# Patient Record
Sex: Female | Born: 1935 | Race: White | Hispanic: No | Smoking: Never smoker
Health system: Southern US, Community
[De-identification: ages and names within clinical notes are randomized; demographics above are authoritative.]

## PROBLEM LIST (undated history)

## (undated) DIAGNOSIS — R61 Generalized hyperhidrosis: Secondary | ICD-10-CM

## (undated) DIAGNOSIS — M797 Fibromyalgia: Secondary | ICD-10-CM

## (undated) DIAGNOSIS — E7800 Pure hypercholesterolemia, unspecified: Secondary | ICD-10-CM

## (undated) DIAGNOSIS — N289 Disorder of kidney and ureter, unspecified: Secondary | ICD-10-CM

## (undated) DIAGNOSIS — K5790 Diverticulosis of intestine, part unspecified, without perforation or abscess without bleeding: Secondary | ICD-10-CM

## (undated) DIAGNOSIS — G629 Polyneuropathy, unspecified: Secondary | ICD-10-CM

## (undated) DIAGNOSIS — I10 Essential (primary) hypertension: Secondary | ICD-10-CM

## (undated) DIAGNOSIS — E119 Type 2 diabetes mellitus without complications: Secondary | ICD-10-CM

## (undated) HISTORY — DX: Diverticulosis of intestine, part unspecified, without perforation or abscess without bleeding: K57.90

## (undated) HISTORY — DX: Type 2 diabetes mellitus without complications: E11.9

## (undated) HISTORY — DX: Essential (primary) hypertension: I10

## (undated) HISTORY — DX: Pure hypercholesterolemia, unspecified: E78.00

## (undated) HISTORY — DX: Generalized hyperhidrosis: R61

## (undated) HISTORY — DX: Disorder of kidney and ureter, unspecified: N28.9

## (undated) HISTORY — PX: OTHER SURGICAL HISTORY: SHX169

## (undated) HISTORY — PX: CATARACT EXTRACTION: SUR2

## (undated) HISTORY — DX: Fibromyalgia: M79.7

## (undated) HISTORY — PX: VAGINAL HYSTERECTOMY: SUR661

---

## 1992-01-07 HISTORY — PX: APPENDECTOMY: SHX54

## 1992-01-07 HISTORY — PX: CHOLECYSTECTOMY: SHX55

## 1997-08-15 ENCOUNTER — Other Ambulatory Visit: Admission: RE | Admit: 1997-08-15 | Discharge: 1997-08-15 | Payer: Self-pay | Admitting: Obstetrics and Gynecology

## 1997-09-02 ENCOUNTER — Inpatient Hospital Stay (HOSPITAL_COMMUNITY): Admission: EM | Admit: 1997-09-02 | Discharge: 1997-09-04 | Payer: Self-pay | Admitting: Emergency Medicine

## 1998-08-31 ENCOUNTER — Other Ambulatory Visit: Admission: RE | Admit: 1998-08-31 | Discharge: 1998-08-31 | Payer: Self-pay | Admitting: Obstetrics and Gynecology

## 1999-07-26 ENCOUNTER — Encounter: Admission: RE | Admit: 1999-07-26 | Discharge: 1999-07-26 | Payer: Self-pay | Admitting: *Deleted

## 1999-07-26 ENCOUNTER — Encounter: Payer: Self-pay | Admitting: *Deleted

## 2000-04-02 ENCOUNTER — Ambulatory Visit (HOSPITAL_COMMUNITY): Admission: RE | Admit: 2000-04-02 | Discharge: 2000-04-02 | Payer: Self-pay | Admitting: Gastroenterology

## 2000-07-28 ENCOUNTER — Encounter: Payer: Self-pay | Admitting: *Deleted

## 2000-07-28 ENCOUNTER — Ambulatory Visit (HOSPITAL_COMMUNITY): Admission: RE | Admit: 2000-07-28 | Discharge: 2000-07-28 | Payer: Self-pay | Admitting: *Deleted

## 2000-09-11 ENCOUNTER — Encounter: Payer: Self-pay | Admitting: Thoracic Surgery

## 2000-09-11 ENCOUNTER — Encounter (INDEPENDENT_AMBULATORY_CARE_PROVIDER_SITE_OTHER): Payer: Self-pay | Admitting: *Deleted

## 2000-09-11 ENCOUNTER — Inpatient Hospital Stay (HOSPITAL_COMMUNITY): Admission: RE | Admit: 2000-09-11 | Discharge: 2000-09-13 | Payer: Self-pay | Admitting: Thoracic Surgery

## 2000-09-12 ENCOUNTER — Encounter: Payer: Self-pay | Admitting: Thoracic Surgery

## 2000-09-13 ENCOUNTER — Encounter: Payer: Self-pay | Admitting: Thoracic Surgery

## 2000-09-22 ENCOUNTER — Encounter: Admission: RE | Admit: 2000-09-22 | Discharge: 2000-09-22 | Payer: Self-pay | Admitting: Thoracic Surgery

## 2000-09-22 ENCOUNTER — Encounter: Payer: Self-pay | Admitting: Thoracic Surgery

## 2000-11-19 ENCOUNTER — Inpatient Hospital Stay (HOSPITAL_COMMUNITY): Admission: RE | Admit: 2000-11-19 | Discharge: 2000-11-23 | Payer: Self-pay | Admitting: Thoracic Surgery

## 2000-11-19 ENCOUNTER — Encounter (INDEPENDENT_AMBULATORY_CARE_PROVIDER_SITE_OTHER): Payer: Self-pay | Admitting: Specialist

## 2000-11-19 ENCOUNTER — Encounter: Payer: Self-pay | Admitting: Thoracic Surgery

## 2000-11-20 ENCOUNTER — Encounter: Payer: Self-pay | Admitting: Thoracic Surgery

## 2000-11-21 ENCOUNTER — Encounter: Payer: Self-pay | Admitting: Thoracic Surgery

## 2000-11-22 ENCOUNTER — Encounter: Payer: Self-pay | Admitting: Thoracic Surgery

## 2000-11-23 ENCOUNTER — Encounter: Payer: Self-pay | Admitting: Thoracic Surgery

## 2000-12-01 ENCOUNTER — Encounter: Payer: Self-pay | Admitting: Thoracic Surgery

## 2000-12-01 ENCOUNTER — Encounter: Admission: RE | Admit: 2000-12-01 | Discharge: 2000-12-01 | Payer: Self-pay | Admitting: Thoracic Surgery

## 2000-12-11 ENCOUNTER — Encounter: Admission: RE | Admit: 2000-12-11 | Discharge: 2000-12-11 | Payer: Self-pay | Admitting: Thoracic Surgery

## 2000-12-11 ENCOUNTER — Encounter: Payer: Self-pay | Admitting: Thoracic Surgery

## 2001-07-29 ENCOUNTER — Encounter: Payer: Self-pay | Admitting: *Deleted

## 2001-07-29 ENCOUNTER — Ambulatory Visit (HOSPITAL_COMMUNITY): Admission: RE | Admit: 2001-07-29 | Discharge: 2001-07-29 | Payer: Self-pay | Admitting: *Deleted

## 2001-08-30 ENCOUNTER — Encounter: Payer: Self-pay | Admitting: Gastroenterology

## 2001-08-30 ENCOUNTER — Encounter: Admission: RE | Admit: 2001-08-30 | Discharge: 2001-08-30 | Payer: Self-pay | Admitting: Gastroenterology

## 2002-01-06 HISTORY — PX: SHOULDER SURGERY: SHX246

## 2002-08-01 ENCOUNTER — Encounter: Payer: Self-pay | Admitting: *Deleted

## 2002-08-01 ENCOUNTER — Ambulatory Visit (HOSPITAL_COMMUNITY): Admission: RE | Admit: 2002-08-01 | Discharge: 2002-08-01 | Payer: Self-pay | Admitting: *Deleted

## 2003-06-21 ENCOUNTER — Inpatient Hospital Stay (HOSPITAL_COMMUNITY): Admission: RE | Admit: 2003-06-21 | Discharge: 2003-06-22 | Payer: Self-pay | Admitting: Orthopedic Surgery

## 2003-08-08 ENCOUNTER — Encounter: Admission: RE | Admit: 2003-08-08 | Discharge: 2003-08-08 | Payer: Self-pay | Admitting: *Deleted

## 2004-02-07 ENCOUNTER — Encounter: Admission: RE | Admit: 2004-02-07 | Discharge: 2004-02-07 | Payer: Self-pay | Admitting: Sports Medicine

## 2004-02-23 ENCOUNTER — Encounter: Admission: RE | Admit: 2004-02-23 | Discharge: 2004-02-23 | Payer: Self-pay | Admitting: Sports Medicine

## 2004-03-08 ENCOUNTER — Encounter: Admission: RE | Admit: 2004-03-08 | Discharge: 2004-03-08 | Payer: Self-pay | Admitting: Sports Medicine

## 2004-08-15 ENCOUNTER — Encounter: Admission: RE | Admit: 2004-08-15 | Discharge: 2004-08-15 | Payer: Self-pay | Admitting: *Deleted

## 2005-08-19 ENCOUNTER — Encounter: Admission: RE | Admit: 2005-08-19 | Discharge: 2005-08-19 | Payer: Self-pay | Admitting: *Deleted

## 2006-08-21 ENCOUNTER — Encounter: Admission: RE | Admit: 2006-08-21 | Discharge: 2006-08-21 | Payer: Self-pay | Admitting: *Deleted

## 2006-12-02 ENCOUNTER — Encounter: Admission: RE | Admit: 2006-12-02 | Discharge: 2006-12-02 | Payer: Self-pay | Admitting: Geriatric Medicine

## 2007-05-12 ENCOUNTER — Encounter: Admission: RE | Admit: 2007-05-12 | Discharge: 2007-05-12 | Payer: Self-pay | Admitting: Gastroenterology

## 2007-08-23 ENCOUNTER — Encounter: Admission: RE | Admit: 2007-08-23 | Discharge: 2007-08-23 | Payer: Self-pay | Admitting: Geriatric Medicine

## 2007-09-03 ENCOUNTER — Encounter: Admission: RE | Admit: 2007-09-03 | Discharge: 2007-09-03 | Payer: Self-pay | Admitting: Geriatric Medicine

## 2008-01-07 HISTORY — PX: TONSILLECTOMY: SUR1361

## 2008-06-01 ENCOUNTER — Encounter: Admission: RE | Admit: 2008-06-01 | Discharge: 2008-06-01 | Payer: Self-pay | Admitting: Sports Medicine

## 2008-06-19 ENCOUNTER — Encounter: Admission: RE | Admit: 2008-06-19 | Discharge: 2008-06-19 | Payer: Self-pay | Admitting: Sports Medicine

## 2008-08-23 ENCOUNTER — Encounter: Admission: RE | Admit: 2008-08-23 | Discharge: 2008-08-23 | Payer: Self-pay | Admitting: Geriatric Medicine

## 2008-10-04 ENCOUNTER — Encounter: Admission: RE | Admit: 2008-10-04 | Discharge: 2008-10-04 | Payer: Self-pay | Admitting: Geriatric Medicine

## 2008-10-26 ENCOUNTER — Encounter: Admission: RE | Admit: 2008-10-26 | Discharge: 2008-10-26 | Payer: Self-pay | Admitting: Sports Medicine

## 2008-12-06 ENCOUNTER — Encounter: Admission: RE | Admit: 2008-12-06 | Discharge: 2008-12-06 | Payer: Self-pay | Admitting: Sports Medicine

## 2008-12-15 ENCOUNTER — Encounter: Admission: RE | Admit: 2008-12-15 | Discharge: 2008-12-15 | Payer: Self-pay | Admitting: Sports Medicine

## 2009-08-27 ENCOUNTER — Encounter: Admission: RE | Admit: 2009-08-27 | Discharge: 2009-08-27 | Payer: Self-pay | Admitting: Geriatric Medicine

## 2010-01-28 ENCOUNTER — Encounter: Payer: Self-pay | Admitting: Sports Medicine

## 2010-05-24 NOTE — Discharge Summary (Signed)
NAME:  Ariel Rogers, Ariel Rogers                           ACCOUNT NO.:  [id number]   MEDICAL RECORD NO.:  [id number]                   PATIENT TYPE:  INP   LOCATION:  5024                                 FACILITY:  [facility]   PHYSICIAN:  Loreta Ave, M.D.              DATE OF BIRTH:  June 07, 1924   DATE OF ADMISSION:  06/21/2003  DATE OF DISCHARGE:  06/22/2003                                 DISCHARGE SUMMARY   ADMISSION DIAGNOSIS:  Advanced degenerative joint disease of the left  shoulder.   DISCHARGE DIAGNOSES:  1. 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  2. Rotator cuff tear.  3. Hypertension.  4. Type 2 diabetes.   PROCEDURE:  Left total shoulder replacement.   HISTORY:  This is a 77 year old female with a three to four year history of  gradually increasing pain of the left shoulder with difficulty with  activities.  Radiographic's show end-stage DJD, left shoulder.  She has  failed conservative treatment.  Indicates for a left total shoulder  replacement.   HOSPITAL COURSE:  A 77 year old white female admitted on June 21, 2003,  after appropriate laboratory studies were obtained as well as 1 g of Ancef  IV on call to the operating room, was taken to the operating room where she  underwent a left total shoulder replacement.  She tolerated the procedure  well.  She had a preoperative scalene block.  She was put on oxygen 2 to 3 L  per minute via nasal cannula postoperatively.  Routine medications were  continued.  PCA pump, using a reduced dose of Dilaudid was used.  She did  very well during the postoperative night.  Her IV was hep-locked and she was  discharged on June 22, 2003, to return back to the office in followup in two  weeks.  EKG reveals normal sinus rhythm with possible anterior infarct, age  undetermined.  There is borderline left axis deviation with poor R-wave  progression, V1 through V6.  No significant change since last tracing.  Shoulder x-ray on  June 21, 2003, of the left reveals good position AP and  through the body transthoracic lateral.  Laboratory studies are notable for  a hemoglobin of 14, hematocrit 40.7, white count 8600, platelets 285,000.  Discharge hemoglobin 11.1, hematocrit 32.3, white count 9600, platelets  213,000.  Prothrombin time was 13 with an INR of 1.  Preoperative  chemistries:  Sodium 142, potassium 4.6, chloride 106, CO2 30, glucose 102,  BUN 15, creatinine 0.9, calcium 9.5, total protein 6.9, albumin 4, AST 20,  ALT 17, ALP 85, total bilirubin 0.6.  Discharge sodium 139, potassium 4,  chloride 105, CO2 30, glucose 128, BUN 12, creatinine 0.9, calcium 8.6.  Urinalysis was benign for a voided urine.  Blood type was A positive,  antibody screen negative.   DISCHARGE MEDICATIONS:  1. The patient is given a prescription for Percocet one  to two tabs q.4h.     p.r.n. pain.  2. Resume preoperative medications.  3. Colace 100 mg b.i.d. as a stool softener.   DIET:  As tolerated.   ACTIVITY:  As taught in physical therapy.   WOUND CARE:  Follow the blue instruction sheet.  Call if increased pain or  swelling.  Keep your dressing on until seen in the office.   FOLLOWUP:  Call the office for an appointment to be seen in three to five  days for dressing removal.   CONDITION ON DISCHARGE:  Improved.      Oris Drone Petrarca, P.A.-C.                Loreta Ave, M.D.    BDP/MEDQ  D:  07/14/2003  T:  07/15/2003  Job:  [id number]

## 2010-05-24 NOTE — Op Note (Signed)
NAME:  Ariel Rogers, Ariel Rogers                           ACCOUNT NO.:  [id number]   MEDICAL RECORD NO.:  [id number]                   PATIENT TYPE:  INP   LOCATION:  2550                                 FACILITY:  [facility]   PHYSICIAN:  Loreta Ave, M.D.              DATE OF BIRTH:  1924/11/02   DATE OF PROCEDURE:  06/21/2003  DATE OF DISCHARGE:                                 OPERATIVE REPORT   PREOPERATIVE DIAGNOSES:  End-stage degenerative arthritis, bone on bone left  shoulder with marked para-articular spurring, loose bodies and extensive  adhesions.  Rotator cuff tear, anterior aspect supraspinatus tendon.   POSTOPERATIVE DIAGNOSES:  End-stage degenerative arthritis, bone on bone  left shoulder with marked para-articular spurring, loose bodies and  extensive adhesions.  Rotator cuff tear, anterior aspect supraspinatus  tendon.   PROCEDURE:  Left shoulder examination under anesthesia, open exploration  with extensive debridement shoulder, removal of spurs and loose bodies,  total shoulder replacement with a cemented pegged #7 [employer] glenoid  component, a press-fit 10 mm humeral stem [employer] with a 40 mm x 15 mm  head.  Repeat of rotator cuff tear including repair of interval tear between  subscapularis and supraspinatus tendon.   SURGEON:  Loreta Ave, M.D.   ASSISTANT:  Arlys John D. Petrarca, P.A.-C.   ANESTHESIA:  General.   SPECIMENS:  Excised bone and soft tissue.   CULTURES:  None.   COMPLICATIONS:  None.   DRESSINGS:  Soft compressive with shoulder immobilizer.   BLOOD LOSS:  Minimal.   DESCRIPTION OF PROCEDURE:  The patient was brought to the operating room and  after adequate anesthesia had been obtained,  placed in the beach chair  position, shoulder positioner, prepped and draped in the usual sterile  fashion.  Shoulder was examined, extremely restricted motion less than 80 degrees of  forward flexion and abduction from bony erosion.  Virtually no  external  rotation and internal rotation of about 60 degrees.   After being prepped and draped, anterior incision from coracoid along the  deltopectoral interval.  Skin and subcutaneous tissues divided.  Deltopectoral interval opened. Cephalic vein initially protected.  With  distal retraction, this tore and this was ligated to prevent bleeding. Front  of the shoulder exposed.  Interval tearing between the front of the  supraspinatus and subscapularis and a little torn off the edge. For the most  part, the __________ remained good.  Subscapularis was taken down, opening  up the interval tear and the supraspinatus behind this was debrided so I  could do a side-to-side closure and repair completion.  Long head of biceps  tendon intact.  Subscapularis was retracted, exposing the shoulder.  Care  was taken to protect neurovascular structures including axillary nerve.  Grade 4 changes throughout. Extensive  spurring.   Humeral head was cut with 30 degrees of retroversion along the surgical  neck.  All recess examined,  all loose bodies removed, all spurs removed as  much as possible to prepare the shoulder for replacement. I got as inferior  as possible on the humerus, removing all spurs that would limit motion.  There were also extensive loose bodies and spurs on the back of the glenoid  removed. Once this was complete, the glenoid was exposed and prepared, after  being measured for a #7 component, reamed of bleeding bone.  I drilled  further pegged component, multiple other small drill holes for cementing.  The humerus was then prepared, all with hand-held reamers up to a #10 mm  distal fit.   Utilizing the resected head and with trials, I chose a 40 x 15 mm head.  When that was complete, all trials were removed. The shoulder was copiously  irrigated.  Cement was prepared, placed on a glenoid component which was  firmly cemented in place, cemented on the glenoid.  When that was complete,   the humerus was driven down into the humeral shaft, which was only 30  degrees of retroversion.  Nice seating and fitting. The 40 x 15 mm head was  attached and the shoulder reduced. This gave good motion, excellent  stability and a good sizing from both sides of the joint. The wound was  irrigated, all recesses examined to be sure all loose bodies removed.   Subscapularis was then repaired with Ethibond including repair of the  anterior supraspinatus tearing and interval tearing to a nice, firm, water-  tight closure of the cuff throughout.  Retractors were removed, hemostasis  throughout.  Deltopectoral interval allowed to close.  Subcutaneous tissue  and subcuticular closure of the wound.  Margins of the wound injected with  Marcaine. Sterile, compressive dressing and shoulder immobilizer applied.  Anesthesia was reversed, brought to the recovery room, tolerated surgery  well, no complications.                                               Loreta Ave, M.D.    DFM/MEDQ  D:  06/21/2003  T:  06/21/2003  Job:  [id number]

## 2010-05-24 NOTE — Discharge Summary (Signed)
[place]. [facility]  Patient:    Ariel Rogers, Ariel Rogers Visit Number: [id number] MRN: [id number]          Service Type: SUR Location: 3300 3314 01 Attending Physician:  Cameron Proud Dictated by:   Dominica Severin, M.D. Admit Date:  11/19/2000 Disc. Date: 11/23/00   CC:         CVTS Office  Robert H. Janey Greaser, M.D.   Discharge Summary  PRIMARY ADMISSION DIAGNOSIS:  hyperhidrosis.  SECONDARY DIAGNOSIS/PAST MEDICAL HISTORY: 1. Hypertension. 2. Anxiety. 3. Depression. 4. Hyperhidrosis. 5. Chronic constipation.  NEW DIAGNOSIS/DISCHARGE DIAGNOSIS:  Hyperhidrosis, status post right ______ and main thoracotomy with dorsal sympathectomy on November 19, 2000.  PROCEDURES:  Right ______/right main thoracotomy with dorsal sympthatectomy done on November 19, 2000.  HOSPITAL COURSE:  This patient is a 77 year old Caucasian female with a known history of hyperhidrosis, status left ______ dorsal sympathectomy on September 07, 2000, by D. Karle Plumber, M.D.  The patient is now returning and plans to proceed with right ______ and dorsal sympathectomy, regarding her scalp condition, as well as underarms and palms.  The patient was seen by D. Karle Plumber, M.D., and agreed to proceed with the procedure as stated above. She underwent the procedure on November 19, 2000.  She tolerated the procedure well.  Her postoperative course was essentially uneventful.  She remained hemodynamically stable. Her respiratory status was stable.  She continued to progress daily throughout her stay.  She was tolerating her diet without difficulty.  She did have some hypokalemia, and her potassium was replaced.  Her wounds remain clean and dry without any signs of complications or infection.  She was ambulating without difficulty, and she is anticipated for discharge the morning of November 23, 2000.  Her discharge condition is stable.  DISCHARGE MEDICATIONS: 1. Clonidine 0.2 gm  b.i.d. 2. Coated aspirin 325 mg daily. 3. Nortriptyline 50 mg daily. 4. Hydrochlorothiazide 25 mg 0.5 tab daily. 5. Paxil 30 mg daily. 6. Centrum vitamin daily. 7. TUMS as needed. 8. Calcium 2 daily. 9. Ultram 50 mg 1 or 2 tablets every 4 hours as needed for pain.  ACTIVITY AND FOLLOWUP: The patient is instructed not to do any driving, avoid heavy lifting, and to avoid any strenuous activity. She is to walk daily and continue breathing exercises.  She is to follow a heart healthy diet.  She is to told that she may shower with mild soap and water.  She is to notify the office of any increased redness, swelling, drainage, or temperatures greater than 101 degrees Fahrenheit.  She is to see D. Karle Plumber, M.D., in one week in our office or call her with that appointment. She is also to obtain a chest x-ray on that day of the [facility].  She is also instructed to follow up with Al Decant. Janey Greaser, M.D., as directed. Dictated by:   Dominica Severin, M.D. Attending Physician:  Cameron Proud DD:  11/22/00 TD:  11/23/00 Job: [id number]

## 2010-05-24 NOTE — Discharge Summary (Signed)
[place]. [facility]  Patient:    Ariel Rogers, Ariel Rogers Visit Number: [id number] MRN: [id number]          Service Type: SUR Location: 3300 3316 01 Attending Physician:  Shellia Cleverly. Luisa Hart Md Dictated by:   Gwenyth Bender, R.N.F.A. Admit Date:  09/11/2000 Discharge Date: 09/13/2000   CC:         Al Decant. Janey Greaser, M.D.   Discharge Summary  DATE OF BIRTH:  1924/03/16  ADMITTING DIAGNOSES:  hyperhidrosis.  PAST MEDICAL HISTORY: 1. Hypertension. 2. Anxiety. 3. Depression.  ALLERGIES:  CODEINE.  DISCHARGE DIAGNOSES: 1. Hyperhidrosis status post left dorsal sympathectomy.  BRIEF HISTORY:  [name] is a 77 year old white female with 37-year history of profuse sweating, especially in her head and scalp.  Also involves her underarm and palms.  She has tried several treatments in the past with no improvement.  She was seen by Dr. Edwyna Shell in this office and after review of the records and evaluation of the patient he recommended T2-T5 sympathectomy for potential relief of her symptoms.  HOSPITAL COURSE:  On September 11, 2000 [name] was electively admitted to [facility] under the care of Dr. Algis Downs. Karle Plumber.  She underwent an uncomplicated left dorsal sympathectomy.  At the conclusion of the procedure she was transferred in stable condition to the PACU.  She was made hemodynamically stable throughout her postoperative course.  [name] is making excellent progress in recovering from her surgery.  She is ready for discharge this morning September 13, 2000.  CONDITION ON DISCHARGE:  Improved.  DISCHARGE INSTRUCTIONS:  Medications, activity, diet, wound care, followup appointments.  Please see the discharge instruction sheet for details.  DISCHARGE MEDICATIONS: 1. Tylox one to two p.o. q.4-6h. p.r.n. for pain. 2. Aspirin 325 mg q.d. 3. Vitamin B12 one pill q.d. 4. Centrum multivitamin q.d. 5. HCTZ 25 mg q.d. 6. Nortriptyline 50 mg  p.o. q.h.s. 7. Paxil 30 mg q.d.  FOLLOW-UP:  [name] will have an appointment at the CVTS office to see Dr. Edwyna Shell in approximately one week.  The office will call with a date and time for that appointment.  She has also been asked to get a chest x-ray at [facility] approximately one hour before that appointment. Dictated by:   Gwenyth Bender, R.N.F.A. Attending Physician:  Shellia Cleverly. Luisa Hart Md DD:  09/13/00 TD:  09/14/00 Job: [id number]

## 2010-05-24 NOTE — Op Note (Signed)
[place]. [facility]  Patient:    Ariel Rogers, Ariel Rogers Visit Number: [id number] MRN: [id number]          Service Type: SUR Location: 3300 3316 01 Attending Physician:  Cameron Proud Dictated by:   D. Karle Plumber, M.D. Proc. Date: 09/11/00 Admit Date:  09/11/2000                             Operative Report  PREOPERATIVE DIAGNOSIS: Severe hyperhidrosis of the scalp.  OPERATION/PROCEDURE: Left dorsal sympathectomy.  SURGEON: D. Karle Plumber, M.D.  ASSISTANT: Sherrie George, P.A.-C.  ANESTHESIA: General.  DESCRIPTION OF PROCEDURE: After percutaneous insertion of all monitoring lines the patient under general anesthesia was turned to the left lateral thoracotomy position and was prepped and draped in the usual sterile manner. This patient had severe scalp hyperhidrosis as well as some axillary hyperhidrosis.  It was thought this might improve and after multiple medications had been unsuccessful this hopefully was going to be able to be improved with dorsal sympathectomy.  After prepping and draping the left chest a trocar site was made in the fourth intercostal space in the mid axillary line and then two trocars in the anterior and posterior axillary space at the third intercostal space.  The two in the third intercostal space were 5 mm and the one in the fourth intercostal space was a 10 mm.  A 10 mm scope was inserted and the lung was deflated, and over the head of the second and third ribs the pleura was excised, excising the second ribs down to the fifth rib. The dorsal sympathetic chain was identified and looped with a hook cautery and then using hook scissors the rami were dissected up from the second, third, fourth, and down to the fifth sympathetic rami, clipping all branches and dividing them with electrocautery.  The chain was then divided distally and dissected anteriorly, dissecting up the rami up to the second rami and then the  second rami was divided just at the head of the superior portion of the second rib just right above the second R T2 ganglion.  All rami were divided proximally and distally in order to get as wide a disruption of the rami as possible.  The area was irrigated copiously and the chain was removed and sent for permanent section.  A chest tube was inserted through the 10 mm port and tied in place with two 0 silks.  The other two trocar sites were closed with 3-0 Vicryl and Steri-Strips.  The lung was reinflated and the patient was returned to the recovery room in stable condition. Dictated by:   D. Karle Plumber, M.D. Attending Physician:  Cameron Proud DD:  09/11/00 TD:  09/12/00 Job: [id number]

## 2010-05-24 NOTE — Op Note (Signed)
[place]. [facility]  Patient:    Ariel Rogers, Ariel Rogers Visit Number: [id number] MRN: [id number]          Service Type: SUR Location: [facility] 2899 20 Attending Physician:  Cameron Proud Dictated by:   D. Karle Plumber, M.D. Admit Date:  11/19/2000                             Operative Report  PREOPERATIVE DIAGNOSIS:  Palmar, axillary and scalp hyperhidrosis.  POSTOPERATIVE DIAGNOSIS:  Palmar, axillary and scalp hyperhidrosis.  OPERATION PERFORMED:  Right video-assisted thoracic surgery, right transaxillary thoracotomy, dorsal sympathectomy.  SURGEON:  D. Karle Plumber, M.D.  FIRST ASSISTANT:  Adair Patter, P.A.  INDICATION:  This patient had a previous left thoracoscopic dorsal sympathectomy with an excellent result for severe scalp and axillary hyperhidrosis.  She now returns for the right side.  DESCRIPTION OF PROCEDURE:  After general anesthesia, she was turned to the right lateral thoracotomy position and she was prepped and draped in usual sterile manner.  A 10-mm trocar site was made in the fourth intercostal space at the midaxillary line and a 0 degree scope was inserted but the lung could not be deflated and it was found to have several adhesions to the apex.  For this reason, two other trocar sites of 5 mm were made at the third intercostal space at the anterior and posterior axillary lines and two 5-mm trocars were inserted and the adhesions were taken down with scissors and electrocautery. Finally, this was able to then start opening the pleura and the pleura was opened from the second down to the fifth space, however, in doing this, sympathetic chain was identified and dissection was started and dissecting up and in the dissection, there started some bleeding from an intercostal vein. An attempt was made to anticoagulate this but it did not control the bleeding, so for this reason, the two upper trocar sites were connected with a 6-  to 7-cm incision and dissection was carried down through the intercostal space and a small Tuffier was inserted to do a transaxillary approach.  The blood was evacuated easily, the bleeding was then seen and then the vein was clipped and then controlled with electrocautery.  After this had been done, the nerve was hooked with a nerve hook and the ganglia were resected, first starting at the second ganglion of T2 and then T3 and then T4 and down to T5.  The ganglia were dissected up, divided and removed, dividing distally first and then approximated just right above the head of the second rib.  After this had been done, the area was irrigated copiously.  A chest tube was placed through the 10-mm trocar site and another chest tube incision was made at the posterior axillary line at the fifth intercostal space and another chest tube inserted and the lung was reexpanded.  The other incision was closed with two pericostals, #1 Vicryl in the axillary area and 2-0 Vicryl as a subcutaneous stitch and 4-0 Monocryl as a subcuticular closure.  Patient was returned to the recovery room in stable condition.Dictated by:   D. Karle Plumber, M.D.  Attending Physician:  Cameron Proud DD:  11/19/00 TD:  11/19/00 Job: [id number]

## 2010-07-05 ENCOUNTER — Other Ambulatory Visit: Payer: Self-pay | Admitting: Geriatric Medicine

## 2010-07-05 DIAGNOSIS — Z1231 Encounter for screening mammogram for malignant neoplasm of breast: Secondary | ICD-10-CM

## 2010-07-12 ENCOUNTER — Other Ambulatory Visit: Payer: Self-pay | Admitting: Geriatric Medicine

## 2010-07-12 ENCOUNTER — Ambulatory Visit
Admission: RE | Admit: 2010-07-12 | Discharge: 2010-07-12 | Disposition: A | Discharge status: Home / Self Care | Payer: Medicare Other | Source: Ambulatory Visit | Attending: Geriatric Medicine | Admitting: Geriatric Medicine

## 2010-07-12 DIAGNOSIS — R51 Headache: Secondary | ICD-10-CM

## 2010-08-29 ENCOUNTER — Ambulatory Visit
Admission: RE | Admit: 2010-08-29 | Discharge: 2010-08-29 | Disposition: A | Discharge status: Home / Self Care | Payer: Medicare Other | Source: Ambulatory Visit | Attending: Geriatric Medicine | Admitting: Geriatric Medicine

## 2010-08-29 DIAGNOSIS — Z1231 Encounter for screening mammogram for malignant neoplasm of breast: Secondary | ICD-10-CM

## 2010-10-11 ENCOUNTER — Ambulatory Visit (HOSPITAL_COMMUNITY)
Admission: RE | Admit: 2010-10-11 | Discharge: 2010-10-11 | Disposition: A | Discharge status: Home / Self Care | Payer: Medicare Other | Source: Ambulatory Visit | Attending: Gastroenterology | Admitting: Gastroenterology

## 2010-10-11 DIAGNOSIS — I10 Essential (primary) hypertension: Secondary | ICD-10-CM | POA: Insufficient documentation

## 2010-10-11 DIAGNOSIS — K3189 Other diseases of stomach and duodenum: Secondary | ICD-10-CM | POA: Insufficient documentation

## 2010-10-11 DIAGNOSIS — K920 Hematemesis: Secondary | ICD-10-CM | POA: Insufficient documentation

## 2010-10-11 DIAGNOSIS — E119 Type 2 diabetes mellitus without complications: Secondary | ICD-10-CM | POA: Insufficient documentation

## 2010-10-11 DIAGNOSIS — E7800 Pure hypercholesterolemia, unspecified: Secondary | ICD-10-CM | POA: Insufficient documentation

## 2010-10-11 DIAGNOSIS — K219 Gastro-esophageal reflux disease without esophagitis: Secondary | ICD-10-CM | POA: Insufficient documentation

## 2010-10-11 LAB — GLUCOSE, CAPILLARY: Glucose-Capillary: 111 mg/dL — ABNORMAL HIGH (ref 70–99)

## 2010-10-15 NOTE — Op Note (Signed)
  NAMEGEARLDENE, FIORENZA NO.:  [id number]  MEDICAL RECORD NO.:  [id number]  LOCATION:  WLEN                         FACILITY:  [facility]  PHYSICIAN:  Danise Edge, M.D.   DATE OF BIRTH:  Jan 16, 1924  DATE OF PROCEDURE:  10/11/2010 DATE OF DISCHARGE:                              OPERATIVE REPORT   PROCEDURE:  Diagnostic esophagogastroduodenoscopy.  HISTORY:  Ms. Ariel Rogers is an 77 year old female, born on [birth year].  The patient awakened one morning with a mouthful of blood.  She has had no further bleeding.  She did not experience and nose bleed. She did not experience abdominal pain, nausea, vomiting, or retching. She takes a proton pump inhibitor on a daily basis.  She was taking Aleve at the time of her bleeding.  Her hemoglobin was normal.  ENDOSCOPIST:  Danise Edge, M.D.  PREMEDICATION: 1. Fentanyl 25 mcg. 2. Versed 2.5 mg.  PROCEDURE IN DETAIL:  The patient was placed in the left lateral decubitus position.  The Pentax gastroscope was passed through the posterior hypopharynx into the proximal esophagus without difficulty. The hypopharynx, larynx, and vocal cords appeared normal.  Esophagoscopy:  The proximal mid and lower segments of the esophageal mucosa appeared normal.  The squamocolumnar junction was somewhat irregular and located at approximately 38 cm from the incisor teeth.  Gastroscopy:  Retroflex view of the gastric cardia and fundus was normal.  The gastric body, antrum, and pylorus appeared normal.  Duodenoscopy:  The duodenal bulb and descending duodenum appeared normal.  Assessment:  Normal esophagogastroduodenoscopy.          ______________________________ Danise Edge, M.D.     MJ/MEDQ  D:  10/11/2010  T:  10/12/2010  Job:  [id number]  cc:   Hal T. Stoneking, M.D. Fax: [phone]  Electronically Signed by Danise Edge M.D. on 10/15/2010 04:09:51 PM

## 2011-01-09 DIAGNOSIS — M5137 Other intervertebral disc degeneration, lumbosacral region: Secondary | ICD-10-CM | POA: Diagnosis not present

## 2011-01-09 DIAGNOSIS — E119 Type 2 diabetes mellitus without complications: Secondary | ICD-10-CM | POA: Diagnosis not present

## 2011-01-09 DIAGNOSIS — G609 Hereditary and idiopathic neuropathy, unspecified: Secondary | ICD-10-CM | POA: Diagnosis not present

## 2011-01-09 DIAGNOSIS — M48061 Spinal stenosis, lumbar region without neurogenic claudication: Secondary | ICD-10-CM | POA: Diagnosis not present

## 2011-02-10 DIAGNOSIS — R109 Unspecified abdominal pain: Secondary | ICD-10-CM | POA: Diagnosis not present

## 2011-03-31 DIAGNOSIS — G609 Hereditary and idiopathic neuropathy, unspecified: Secondary | ICD-10-CM | POA: Diagnosis not present

## 2011-03-31 DIAGNOSIS — E1149 Type 2 diabetes mellitus with other diabetic neurological complication: Secondary | ICD-10-CM | POA: Diagnosis not present

## 2011-03-31 DIAGNOSIS — E1129 Type 2 diabetes mellitus with other diabetic kidney complication: Secondary | ICD-10-CM | POA: Diagnosis not present

## 2011-03-31 DIAGNOSIS — I129 Hypertensive chronic kidney disease with stage 1 through stage 4 chronic kidney disease, or unspecified chronic kidney disease: Secondary | ICD-10-CM | POA: Diagnosis not present

## 2011-06-25 DIAGNOSIS — G609 Hereditary and idiopathic neuropathy, unspecified: Secondary | ICD-10-CM | POA: Diagnosis not present

## 2011-06-25 DIAGNOSIS — E119 Type 2 diabetes mellitus without complications: Secondary | ICD-10-CM | POA: Diagnosis not present

## 2011-06-27 DIAGNOSIS — E119 Type 2 diabetes mellitus without complications: Secondary | ICD-10-CM | POA: Diagnosis not present

## 2011-06-27 DIAGNOSIS — R110 Nausea: Secondary | ICD-10-CM | POA: Diagnosis not present

## 2011-06-27 DIAGNOSIS — N390 Urinary tract infection, site not specified: Secondary | ICD-10-CM | POA: Diagnosis not present

## 2011-06-27 DIAGNOSIS — A499 Bacterial infection, unspecified: Secondary | ICD-10-CM | POA: Diagnosis not present

## 2011-06-27 DIAGNOSIS — E7800 Pure hypercholesterolemia, unspecified: Secondary | ICD-10-CM | POA: Diagnosis not present

## 2011-07-03 DIAGNOSIS — E1129 Type 2 diabetes mellitus with other diabetic kidney complication: Secondary | ICD-10-CM | POA: Diagnosis not present

## 2011-07-03 DIAGNOSIS — R42 Dizziness and giddiness: Secondary | ICD-10-CM | POA: Diagnosis not present

## 2011-07-04 DIAGNOSIS — E119 Type 2 diabetes mellitus without complications: Secondary | ICD-10-CM | POA: Diagnosis not present

## 2011-07-08 DIAGNOSIS — H8110 Benign paroxysmal vertigo, unspecified ear: Secondary | ICD-10-CM | POA: Diagnosis not present

## 2011-07-09 ENCOUNTER — Other Ambulatory Visit: Payer: Self-pay | Admitting: Geriatric Medicine

## 2011-07-09 DIAGNOSIS — R51 Headache: Secondary | ICD-10-CM

## 2011-07-09 DIAGNOSIS — Z1331 Encounter for screening for depression: Secondary | ICD-10-CM | POA: Diagnosis not present

## 2011-07-09 DIAGNOSIS — N951 Menopausal and female climacteric states: Secondary | ICD-10-CM | POA: Diagnosis not present

## 2011-07-09 DIAGNOSIS — Z0000 Encounter for general adult medical examination without abnormal findings: Secondary | ICD-10-CM | POA: Diagnosis not present

## 2011-07-17 ENCOUNTER — Ambulatory Visit
Admission: RE | Admit: 2011-07-17 | Discharge: 2011-07-17 | Disposition: A | Discharge status: Home / Self Care | Payer: Medicare Other | Source: Ambulatory Visit | Attending: Geriatric Medicine | Admitting: Geriatric Medicine

## 2011-07-17 DIAGNOSIS — R51 Headache: Secondary | ICD-10-CM

## 2011-07-17 DIAGNOSIS — I6350 Cerebral infarction due to unspecified occlusion or stenosis of unspecified cerebral artery: Secondary | ICD-10-CM | POA: Diagnosis not present

## 2011-07-17 DIAGNOSIS — H9190 Unspecified hearing loss, unspecified ear: Secondary | ICD-10-CM | POA: Diagnosis not present

## 2011-07-18 DIAGNOSIS — E237 Disorder of pituitary gland, unspecified: Secondary | ICD-10-CM | POA: Diagnosis not present

## 2011-07-22 ENCOUNTER — Other Ambulatory Visit: Payer: Self-pay | Admitting: Geriatric Medicine

## 2011-07-22 DIAGNOSIS — Z1231 Encounter for screening mammogram for malignant neoplasm of breast: Secondary | ICD-10-CM

## 2011-08-21 DIAGNOSIS — M999 Biomechanical lesion, unspecified: Secondary | ICD-10-CM | POA: Diagnosis not present

## 2011-08-21 DIAGNOSIS — M5137 Other intervertebral disc degeneration, lumbosacral region: Secondary | ICD-10-CM | POA: Diagnosis not present

## 2011-08-21 DIAGNOSIS — M25559 Pain in unspecified hip: Secondary | ICD-10-CM | POA: Diagnosis not present

## 2011-09-01 ENCOUNTER — Ambulatory Visit
Admission: RE | Admit: 2011-09-01 | Discharge: 2011-09-01 | Disposition: A | Discharge status: Home / Self Care | Payer: Medicare Other | Source: Ambulatory Visit | Attending: Geriatric Medicine | Admitting: Geriatric Medicine

## 2011-09-01 DIAGNOSIS — Z1231 Encounter for screening mammogram for malignant neoplasm of breast: Secondary | ICD-10-CM | POA: Diagnosis not present

## 2011-09-04 DIAGNOSIS — E119 Type 2 diabetes mellitus without complications: Secondary | ICD-10-CM | POA: Diagnosis not present

## 2011-10-07 DIAGNOSIS — M999 Biomechanical lesion, unspecified: Secondary | ICD-10-CM | POA: Diagnosis not present

## 2011-10-07 DIAGNOSIS — M5137 Other intervertebral disc degeneration, lumbosacral region: Secondary | ICD-10-CM | POA: Diagnosis not present

## 2011-10-07 DIAGNOSIS — M25559 Pain in unspecified hip: Secondary | ICD-10-CM | POA: Diagnosis not present

## 2011-10-09 DIAGNOSIS — M999 Biomechanical lesion, unspecified: Secondary | ICD-10-CM | POA: Diagnosis not present

## 2011-10-09 DIAGNOSIS — M25559 Pain in unspecified hip: Secondary | ICD-10-CM | POA: Diagnosis not present

## 2011-10-09 DIAGNOSIS — M5137 Other intervertebral disc degeneration, lumbosacral region: Secondary | ICD-10-CM | POA: Diagnosis not present

## 2011-10-23 DIAGNOSIS — M48061 Spinal stenosis, lumbar region without neurogenic claudication: Secondary | ICD-10-CM | POA: Diagnosis not present

## 2011-10-23 DIAGNOSIS — W19XXXA Unspecified fall, initial encounter: Secondary | ICD-10-CM | POA: Diagnosis not present

## 2011-10-28 DIAGNOSIS — M545 Low back pain: Secondary | ICD-10-CM | POA: Diagnosis not present

## 2011-10-28 DIAGNOSIS — M79609 Pain in unspecified limb: Secondary | ICD-10-CM | POA: Diagnosis not present

## 2011-10-30 DIAGNOSIS — M545 Low back pain: Secondary | ICD-10-CM | POA: Diagnosis not present

## 2011-10-30 DIAGNOSIS — M79609 Pain in unspecified limb: Secondary | ICD-10-CM | POA: Diagnosis not present

## 2011-11-04 DIAGNOSIS — M545 Low back pain: Secondary | ICD-10-CM | POA: Diagnosis not present

## 2011-11-04 DIAGNOSIS — M79609 Pain in unspecified limb: Secondary | ICD-10-CM | POA: Diagnosis not present

## 2011-11-11 DIAGNOSIS — E1149 Type 2 diabetes mellitus with other diabetic neurological complication: Secondary | ICD-10-CM | POA: Diagnosis not present

## 2011-11-11 DIAGNOSIS — Z79899 Other long term (current) drug therapy: Secondary | ICD-10-CM | POA: Diagnosis not present

## 2011-11-11 DIAGNOSIS — E1142 Type 2 diabetes mellitus with diabetic polyneuropathy: Secondary | ICD-10-CM | POA: Diagnosis not present

## 2011-11-11 DIAGNOSIS — E7800 Pure hypercholesterolemia, unspecified: Secondary | ICD-10-CM | POA: Diagnosis not present

## 2011-11-11 DIAGNOSIS — I129 Hypertensive chronic kidney disease with stage 1 through stage 4 chronic kidney disease, or unspecified chronic kidney disease: Secondary | ICD-10-CM | POA: Diagnosis not present

## 2011-11-21 DIAGNOSIS — M545 Low back pain: Secondary | ICD-10-CM | POA: Diagnosis not present

## 2011-11-21 DIAGNOSIS — M79609 Pain in unspecified limb: Secondary | ICD-10-CM | POA: Diagnosis not present

## 2011-11-26 DIAGNOSIS — R350 Frequency of micturition: Secondary | ICD-10-CM | POA: Diagnosis not present

## 2011-11-26 DIAGNOSIS — N390 Urinary tract infection, site not specified: Secondary | ICD-10-CM | POA: Diagnosis not present

## 2011-11-26 DIAGNOSIS — I129 Hypertensive chronic kidney disease with stage 1 through stage 4 chronic kidney disease, or unspecified chronic kidney disease: Secondary | ICD-10-CM | POA: Diagnosis not present

## 2011-12-11 ENCOUNTER — Other Ambulatory Visit: Payer: Self-pay | Admitting: Internal Medicine

## 2011-12-11 DIAGNOSIS — G459 Transient cerebral ischemic attack, unspecified: Secondary | ICD-10-CM

## 2011-12-11 DIAGNOSIS — R9431 Abnormal electrocardiogram [ECG] [EKG]: Secondary | ICD-10-CM | POA: Diagnosis not present

## 2011-12-11 DIAGNOSIS — R209 Unspecified disturbances of skin sensation: Secondary | ICD-10-CM | POA: Diagnosis not present

## 2011-12-12 ENCOUNTER — Other Ambulatory Visit: Payer: Medicare Other

## 2011-12-12 ENCOUNTER — Inpatient Hospital Stay: Admission: RE | Admit: 2011-12-12 | Payer: Medicare Other | Source: Ambulatory Visit

## 2011-12-26 DIAGNOSIS — E119 Type 2 diabetes mellitus without complications: Secondary | ICD-10-CM | POA: Diagnosis not present

## 2011-12-26 DIAGNOSIS — G609 Hereditary and idiopathic neuropathy, unspecified: Secondary | ICD-10-CM | POA: Diagnosis not present

## 2011-12-26 DIAGNOSIS — M5137 Other intervertebral disc degeneration, lumbosacral region: Secondary | ICD-10-CM | POA: Diagnosis not present

## 2011-12-26 DIAGNOSIS — M48061 Spinal stenosis, lumbar region without neurogenic claudication: Secondary | ICD-10-CM | POA: Diagnosis not present

## 2012-02-13 ENCOUNTER — Ambulatory Visit
Admission: RE | Admit: 2012-02-13 | Discharge: 2012-02-13 | Disposition: A | Discharge status: Home / Self Care | Payer: Medicare Other | Source: Ambulatory Visit | Attending: Internal Medicine | Admitting: Internal Medicine

## 2012-02-13 DIAGNOSIS — G459 Transient cerebral ischemic attack, unspecified: Secondary | ICD-10-CM

## 2012-02-13 DIAGNOSIS — R51 Headache: Secondary | ICD-10-CM | POA: Diagnosis not present

## 2012-02-13 DIAGNOSIS — I658 Occlusion and stenosis of other precerebral arteries: Secondary | ICD-10-CM | POA: Diagnosis not present

## 2012-03-09 DIAGNOSIS — E1149 Type 2 diabetes mellitus with other diabetic neurological complication: Secondary | ICD-10-CM | POA: Diagnosis not present

## 2012-03-09 DIAGNOSIS — E1142 Type 2 diabetes mellitus with diabetic polyneuropathy: Secondary | ICD-10-CM | POA: Diagnosis not present

## 2012-03-09 DIAGNOSIS — I129 Hypertensive chronic kidney disease with stage 1 through stage 4 chronic kidney disease, or unspecified chronic kidney disease: Secondary | ICD-10-CM | POA: Diagnosis not present

## 2012-03-09 DIAGNOSIS — Z79899 Other long term (current) drug therapy: Secondary | ICD-10-CM | POA: Diagnosis not present

## 2012-04-08 DIAGNOSIS — I129 Hypertensive chronic kidney disease with stage 1 through stage 4 chronic kidney disease, or unspecified chronic kidney disease: Secondary | ICD-10-CM | POA: Diagnosis not present

## 2012-04-08 DIAGNOSIS — Z79899 Other long term (current) drug therapy: Secondary | ICD-10-CM | POA: Diagnosis not present

## 2012-04-08 DIAGNOSIS — E1149 Type 2 diabetes mellitus with other diabetic neurological complication: Secondary | ICD-10-CM | POA: Diagnosis not present

## 2012-04-08 DIAGNOSIS — E1142 Type 2 diabetes mellitus with diabetic polyneuropathy: Secondary | ICD-10-CM | POA: Diagnosis not present

## 2012-05-18 DIAGNOSIS — R269 Unspecified abnormalities of gait and mobility: Secondary | ICD-10-CM | POA: Diagnosis not present

## 2012-05-18 DIAGNOSIS — Z9181 History of falling: Secondary | ICD-10-CM | POA: Diagnosis not present

## 2012-06-10 DIAGNOSIS — I129 Hypertensive chronic kidney disease with stage 1 through stage 4 chronic kidney disease, or unspecified chronic kidney disease: Secondary | ICD-10-CM | POA: Diagnosis not present

## 2012-06-10 DIAGNOSIS — R110 Nausea: Secondary | ICD-10-CM | POA: Diagnosis not present

## 2012-06-10 DIAGNOSIS — R51 Headache: Secondary | ICD-10-CM | POA: Diagnosis not present

## 2012-06-14 ENCOUNTER — Other Ambulatory Visit: Payer: Self-pay

## 2012-06-14 DIAGNOSIS — Z1231 Encounter for screening mammogram for malignant neoplasm of breast: Secondary | ICD-10-CM

## 2012-08-02 ENCOUNTER — Ambulatory Visit
Admission: RE | Admit: 2012-08-02 | Discharge: 2012-08-02 | Disposition: A | Discharge status: Home / Self Care | Payer: Medicare Other | Source: Ambulatory Visit | Attending: Geriatric Medicine | Admitting: Geriatric Medicine

## 2012-08-02 ENCOUNTER — Other Ambulatory Visit: Payer: Self-pay | Admitting: Geriatric Medicine

## 2012-08-02 DIAGNOSIS — M545 Low back pain: Secondary | ICD-10-CM | POA: Diagnosis not present

## 2012-08-02 DIAGNOSIS — Z0000 Encounter for general adult medical examination without abnormal findings: Secondary | ICD-10-CM | POA: Diagnosis not present

## 2012-08-02 DIAGNOSIS — E041 Nontoxic single thyroid nodule: Secondary | ICD-10-CM | POA: Diagnosis not present

## 2012-08-02 DIAGNOSIS — E042 Nontoxic multinodular goiter: Secondary | ICD-10-CM | POA: Diagnosis not present

## 2012-08-02 DIAGNOSIS — Z1331 Encounter for screening for depression: Secondary | ICD-10-CM | POA: Diagnosis not present

## 2012-08-04 ENCOUNTER — Other Ambulatory Visit: Payer: Self-pay | Admitting: Geriatric Medicine

## 2012-08-04 DIAGNOSIS — E041 Nontoxic single thyroid nodule: Secondary | ICD-10-CM

## 2012-08-10 ENCOUNTER — Ambulatory Visit
Admission: RE | Admit: 2012-08-10 | Discharge: 2012-08-10 | Disposition: A | Discharge status: Home / Self Care | Payer: Medicare Other | Source: Ambulatory Visit | Attending: Geriatric Medicine | Admitting: Geriatric Medicine

## 2012-08-10 ENCOUNTER — Other Ambulatory Visit (HOSPITAL_COMMUNITY)
Admission: RE | Admit: 2012-08-10 | Discharge: 2012-08-10 | Disposition: A | Discharge status: Home / Self Care | Payer: Medicare Other | Source: Ambulatory Visit | Attending: Interventional Radiology | Admitting: Interventional Radiology

## 2012-08-10 DIAGNOSIS — E041 Nontoxic single thyroid nodule: Secondary | ICD-10-CM | POA: Diagnosis not present

## 2012-08-10 DIAGNOSIS — E049 Nontoxic goiter, unspecified: Secondary | ICD-10-CM | POA: Diagnosis not present

## 2012-08-11 ENCOUNTER — Inpatient Hospital Stay: Admission: RE | Admit: 2012-08-11 | Payer: Medicare Other | Source: Ambulatory Visit

## 2012-09-02 ENCOUNTER — Ambulatory Visit
Admission: RE | Admit: 2012-09-02 | Discharge: 2012-09-02 | Disposition: A | Discharge status: Home / Self Care | Payer: Medicare Other | Source: Ambulatory Visit

## 2012-09-02 DIAGNOSIS — Z1231 Encounter for screening mammogram for malignant neoplasm of breast: Secondary | ICD-10-CM

## 2012-11-09 ENCOUNTER — Other Ambulatory Visit: Payer: Self-pay | Admitting: Diagnostic Neuroimaging

## 2012-11-09 MED ORDER — LIDOCAINE 5 % EX PTCH: 1.0000 | MEDICATED_PATCH | CUTANEOUS | Status: AC

## 2012-11-10 NOTE — Telephone Encounter (Signed)
Call pt to let her know that her prescription was ready to be picked up, pt verbalized understanding and said she would pick it up at the front desk.

## 2012-11-11 DIAGNOSIS — G608 Other hereditary and idiopathic neuropathies: Secondary | ICD-10-CM | POA: Diagnosis not present

## 2012-11-11 DIAGNOSIS — M5137 Other intervertebral disc degeneration, lumbosacral region: Secondary | ICD-10-CM | POA: Diagnosis not present

## 2012-11-11 DIAGNOSIS — M25559 Pain in unspecified hip: Secondary | ICD-10-CM | POA: Diagnosis not present

## 2012-11-11 DIAGNOSIS — M999 Biomechanical lesion, unspecified: Secondary | ICD-10-CM | POA: Diagnosis not present

## 2012-11-15 DIAGNOSIS — M999 Biomechanical lesion, unspecified: Secondary | ICD-10-CM | POA: Diagnosis not present

## 2012-11-15 DIAGNOSIS — G608 Other hereditary and idiopathic neuropathies: Secondary | ICD-10-CM | POA: Diagnosis not present

## 2012-11-15 DIAGNOSIS — M5137 Other intervertebral disc degeneration, lumbosacral region: Secondary | ICD-10-CM | POA: Diagnosis not present

## 2012-11-15 DIAGNOSIS — M25559 Pain in unspecified hip: Secondary | ICD-10-CM | POA: Diagnosis not present

## 2012-12-10 DIAGNOSIS — E1149 Type 2 diabetes mellitus with other diabetic neurological complication: Secondary | ICD-10-CM | POA: Diagnosis not present

## 2012-12-10 DIAGNOSIS — E1142 Type 2 diabetes mellitus with diabetic polyneuropathy: Secondary | ICD-10-CM | POA: Diagnosis not present

## 2012-12-10 DIAGNOSIS — I129 Hypertensive chronic kidney disease with stage 1 through stage 4 chronic kidney disease, or unspecified chronic kidney disease: Secondary | ICD-10-CM | POA: Diagnosis not present

## 2012-12-10 DIAGNOSIS — Z79899 Other long term (current) drug therapy: Secondary | ICD-10-CM | POA: Diagnosis not present

## 2012-12-10 DIAGNOSIS — E1129 Type 2 diabetes mellitus with other diabetic kidney complication: Secondary | ICD-10-CM | POA: Diagnosis not present

## 2012-12-27 DIAGNOSIS — R110 Nausea: Secondary | ICD-10-CM | POA: Diagnosis not present

## 2012-12-27 DIAGNOSIS — I129 Hypertensive chronic kidney disease with stage 1 through stage 4 chronic kidney disease, or unspecified chronic kidney disease: Secondary | ICD-10-CM | POA: Diagnosis not present

## 2012-12-27 DIAGNOSIS — R51 Headache: Secondary | ICD-10-CM | POA: Diagnosis not present

## 2012-12-27 DIAGNOSIS — M25529 Pain in unspecified elbow: Secondary | ICD-10-CM | POA: Diagnosis not present

## 2013-01-03 ENCOUNTER — Ambulatory Visit: Payer: Self-pay | Admitting: Diagnostic Neuroimaging

## 2013-01-03 DIAGNOSIS — R82998 Other abnormal findings in urine: Secondary | ICD-10-CM | POA: Diagnosis not present

## 2013-01-03 DIAGNOSIS — R112 Nausea with vomiting, unspecified: Secondary | ICD-10-CM | POA: Diagnosis not present

## 2013-01-10 ENCOUNTER — Ambulatory Visit: Payer: Self-pay | Admitting: Diagnostic Neuroimaging

## 2013-01-12 ENCOUNTER — Other Ambulatory Visit: Payer: Medicare Other

## 2013-01-12 ENCOUNTER — Other Ambulatory Visit: Payer: Self-pay | Admitting: Geriatric Medicine

## 2013-01-12 DIAGNOSIS — R109 Unspecified abdominal pain: Secondary | ICD-10-CM | POA: Diagnosis not present

## 2013-01-12 DIAGNOSIS — Z8719 Personal history of other diseases of the digestive system: Secondary | ICD-10-CM | POA: Diagnosis not present

## 2013-01-13 ENCOUNTER — Ambulatory Visit
Admission: RE | Admit: 2013-01-13 | Discharge: 2013-01-13 | Disposition: A | Discharge status: Home / Self Care | Payer: Medicare Other | Source: Ambulatory Visit | Attending: Geriatric Medicine | Admitting: Geriatric Medicine

## 2013-01-13 ENCOUNTER — Other Ambulatory Visit: Payer: Self-pay | Admitting: Geriatric Medicine

## 2013-01-13 DIAGNOSIS — R109 Unspecified abdominal pain: Secondary | ICD-10-CM

## 2013-01-13 MED ORDER — IOHEXOL 300 MG/ML  SOLN
100.0000 mL | Freq: Once | INTRAMUSCULAR | Status: AC | PRN
  Administered 2013-01-13: 100 mL via INTRAVENOUS

## 2013-01-15 DIAGNOSIS — R110 Nausea: Secondary | ICD-10-CM | POA: Diagnosis not present

## 2013-01-26 ENCOUNTER — Emergency Department (HOSPITAL_COMMUNITY)
Admission: EM | Admit: 2013-01-26 | Discharge: 2013-01-26 | Disposition: A | Discharge status: Home / Self Care | Payer: Medicare Other | Attending: Emergency Medicine | Admitting: Emergency Medicine

## 2013-01-26 ENCOUNTER — Encounter (HOSPITAL_COMMUNITY): Payer: Self-pay | Admitting: Emergency Medicine

## 2013-01-26 ENCOUNTER — Emergency Department (HOSPITAL_COMMUNITY): Payer: Medicare Other

## 2013-01-26 DIAGNOSIS — Z872 Personal history of diseases of the skin and subcutaneous tissue: Secondary | ICD-10-CM | POA: Insufficient documentation

## 2013-01-26 DIAGNOSIS — R202 Paresthesia of skin: Secondary | ICD-10-CM

## 2013-01-26 DIAGNOSIS — IMO0001 Reserved for inherently not codable concepts without codable children: Secondary | ICD-10-CM | POA: Diagnosis not present

## 2013-01-26 DIAGNOSIS — E119 Type 2 diabetes mellitus without complications: Secondary | ICD-10-CM | POA: Insufficient documentation

## 2013-01-26 DIAGNOSIS — Z87448 Personal history of other diseases of urinary system: Secondary | ICD-10-CM | POA: Diagnosis not present

## 2013-01-26 DIAGNOSIS — Z79899 Other long term (current) drug therapy: Secondary | ICD-10-CM | POA: Diagnosis not present

## 2013-01-26 DIAGNOSIS — I10 Essential (primary) hypertension: Secondary | ICD-10-CM | POA: Insufficient documentation

## 2013-01-26 DIAGNOSIS — R6889 Other general symptoms and signs: Secondary | ICD-10-CM | POA: Diagnosis not present

## 2013-01-26 DIAGNOSIS — R209 Unspecified disturbances of skin sensation: Secondary | ICD-10-CM | POA: Diagnosis not present

## 2013-01-26 DIAGNOSIS — E7800 Pure hypercholesterolemia, unspecified: Secondary | ICD-10-CM | POA: Diagnosis not present

## 2013-01-26 DIAGNOSIS — R0789 Other chest pain: Secondary | ICD-10-CM | POA: Diagnosis not present

## 2013-01-26 DIAGNOSIS — Z8719 Personal history of other diseases of the digestive system: Secondary | ICD-10-CM | POA: Diagnosis not present

## 2013-01-26 DIAGNOSIS — Z8669 Personal history of other diseases of the nervous system and sense organs: Secondary | ICD-10-CM | POA: Insufficient documentation

## 2013-01-26 HISTORY — DX: Polyneuropathy, unspecified: G62.9

## 2013-01-26 LAB — URINE MICROSCOPIC-ADD ON

## 2013-01-26 LAB — BASIC METABOLIC PANEL
BUN: 14 mg/dL (ref 6–23)
CALCIUM: 9.4 mg/dL (ref 8.4–10.5)
CHLORIDE: 98 meq/L (ref 96–112)
CO2: 24 meq/L (ref 19–32)
Creatinine, Ser: 0.97 mg/dL (ref 0.50–1.10)
GFR calc Af Amer: 60 mL/min — ABNORMAL LOW (ref >90)
GFR calc non Af Amer: 51 mL/min — ABNORMAL LOW (ref >90)
GLUCOSE: 120 mg/dL — AB (ref 70–99)
POTASSIUM: 4.4 meq/L (ref 3.7–5.3)
SODIUM: 139 meq/L (ref 137–147)

## 2013-01-26 LAB — URINALYSIS, ROUTINE W REFLEX MICROSCOPIC
Bilirubin Urine: NEGATIVE
GLUCOSE, UA: NEGATIVE mg/dL
HGB URINE DIPSTICK: NEGATIVE
Ketones, ur: NEGATIVE mg/dL
Nitrite: NEGATIVE
PH: 6 (ref 5.0–8.0)
Protein, ur: NEGATIVE mg/dL
SPECIFIC GRAVITY, URINE: 1.009 (ref 1.005–1.030)
Urobilinogen, UA: 0.2 mg/dL (ref 0.0–1.0)

## 2013-01-26 LAB — CBC WITH DIFFERENTIAL/PLATELET
Basophils Absolute: 0 10*3/uL (ref 0.0–0.1)
Basophils Relative: 0 % (ref 0–1)
EOS PCT: 2 % (ref 0–5)
Eosinophils Absolute: 0.1 10*3/uL (ref 0.0–0.7)
HCT: 38.8 % (ref 36.0–46.0)
Hemoglobin: 13.1 g/dL (ref 12.0–15.0)
LYMPHS ABS: 1.6 10*3/uL (ref 0.7–4.0)
LYMPHS PCT: 21 % (ref 12–46)
MCH: 30.8 pg (ref 26.0–34.0)
MCHC: 33.8 g/dL (ref 30.0–36.0)
MCV: 91.1 fL (ref 78.0–100.0)
Monocytes Absolute: 0.6 10*3/uL (ref 0.1–1.0)
Monocytes Relative: 8 % (ref 3–12)
Neutro Abs: 5.1 10*3/uL (ref 1.7–7.7)
Neutrophils Relative %: 69 % (ref 43–77)
PLATELETS: 210 10*3/uL (ref 150–400)
RBC: 4.26 MIL/uL (ref 3.87–5.11)
RDW: 13.6 % (ref 11.5–15.5)
WBC: 7.4 10*3/uL (ref 4.0–10.5)

## 2013-01-26 MED ORDER — CLONIDINE HCL 0.1 MG PO TABS
0.2000 mg | ORAL_TABLET | Freq: Two times a day (BID) | ORAL | Status: DC
  Administered 2013-01-26: 0.2 mg via ORAL
  Filled 2013-01-26: qty 2

## 2013-01-26 MED ORDER — HYDROCHLOROTHIAZIDE 25 MG PO TABS
25.0000 mg | ORAL_TABLET | Freq: Every day | ORAL | Status: DC
  Administered 2013-01-26: 25 mg via ORAL
  Filled 2013-01-26: qty 1

## 2013-01-26 NOTE — ED Provider Notes (Signed)
CSN: [id number]     Arrival date & time 01/26/13  9476 History   First MD Initiated Contact with Patient 01/26/13 [phone]     Chief Complaint  Patient presents with  . Numbness   (Consider location/radiation/quality/duration/timing/severity/associated sxs/prior Treatment) The history is provided by the patient.  Ariel Rogers is a 80 y.o. female presents for evaluation of a tingling sensation in her lips and both legs. She feels like it started during the night. After she awoke she drove to a community health clinic, where a nurse, called an ambulance to bring her here. She denies headache, chest pain, weakness, dizziness, nausea, vomiting, fever, chills, cough, or shortness of breath. She has not had this previously. She has not taken her morning medications, yet. There are no other known modifying factors.   Past Medical History  Diagnosis Date  . Diabetes   . High cholesterol   . Hypertension   . Fibromyalgia   . Hyperhidrosis   . Renal disease   . Diverticulosis   . Neuropathy     in back and legs   Past Surgical History  Procedure Laterality Date  . Cholecystectomy  1994  . Appendectomy  1994  . Tonsillectomy  2010  . Other surgical history      hyperhidrosis  . Cataract extraction    . Vaginal hysterectomy  age 26  . Shoulder surgery  2004   Family History  Problem Relation Age of Onset  . Heart Problems Son   . Lung cancer Son   . Pneumonia Mother   . Lung cancer Father   . Lung cancer Sister   . Lung cancer Sister   . Heart disease Sister    History  Substance Use Topics  . Smoking status: Never Smoker   . Smokeless tobacco: Not on file  . Alcohol Use: No   OB History   Grav Para Term Preterm Abortions TAB SAB Ect Mult Living                 Review of Systems  All other systems reviewed and are negative.    Allergies  Caffeine and Codeine  Home Medications   Current Outpatient Rx  Name  Route  Sig  Dispense  Refill  . acetaminophen (TYLENOL)  500 MG tablet   Oral   Take 500 mg by mouth at bedtime as needed (sleep, takes along with the sleep aid).         Marland Kitchen amitriptyline (ELAVIL) 50 MG tablet   Oral   Take 50 mg by mouth at bedtime as needed for sleep. 1/2 tab once daily         . cloNIDine (CATAPRES) 0.2 MG tablet   Oral   Take 0.2 mg by mouth 2 (two) times daily.         Marland Kitchen doxylamine, Sleep, (SLEEP AID) 25 MG tablet   Oral   Take 25 mg by mouth at bedtime as needed.         . hydrochlorothiazide (HYDRODIURIL) 25 MG tablet   Oral   Take 25 mg by mouth daily.         Marland Kitchen lidocaine (LIDODERM) 5 %   Transdermal   Place 1 patch onto the skin daily. Remove & Discard patch within 12 hours or as directed by MD   90 patch   0     Apply one half patch to each foot daily for 12 hou ...   . lovastatin (ALTOPREV) 40 MG 24  hr tablet   Oral   Take 40 mg by mouth at bedtime.         . Multiple Vitamins-Minerals (MULTIVITAMIN WITH MINERALS) tablet   Oral   Take 1 tablet by mouth daily.         . multivitamin (METANX) 3-35-2 MG TABS tablet   Oral   Take 1 tablet by mouth 2 (two) times daily.         . Omega-3 Fatty Acids (FISH OIL) 1000 MG CAPS   Oral   Take 1 capsule by mouth 2 (two) times daily.         . pantoprazole (PROTONIX) 40 MG tablet   Oral   Take 40 mg by mouth daily.         . pioglitazone (ACTOS) 30 MG tablet   Oral   Take 30 mg by mouth daily.         . vitamin D, CHOLECALCIFEROL, 400 UNITS tablet   Oral   Take 400 Units by mouth daily.          BP 175/71  Pulse 73  Temp(Src) 97.5 F (36.4 C) (Oral)  Resp 16  SpO2 100% Physical Exam  Nursing note and vitals reviewed. Constitutional: She is oriented to person, place, and time. She appears well-developed.  Elderly, frail  HENT:  Head: Normocephalic and atraumatic.  Eyes: Conjunctivae and EOM are normal. Pupils are equal, round, and reactive to light.  Neck: Normal range of motion and phonation normal. Neck supple.   Cardiovascular: Normal rate, regular rhythm and intact distal pulses.   Pulmonary/Chest: Effort normal and breath sounds normal. She exhibits no tenderness.  Abdominal: Soft. She exhibits no distension. There is no tenderness. There is no guarding.  Musculoskeletal: Normal range of motion.  Neurological: She is alert and oriented to person, place, and time. She exhibits normal muscle tone.  No dysarthria, no aphasia no nystagmus. There is normal. Light touch sensation in the bilateral face, hands, and feet. Strength is 5 out of 5 in the arms, and legs, bilaterally  Skin: Skin is warm and dry.  Psychiatric: She has a normal mood and affect. Her behavior is normal. Judgment and thought content normal.    ED Course  Procedures (including critical care time) Medications  cloNIDine (CATAPRES) tablet 0.2 mg (0.2 mg Oral Given 01/26/13 1357)  hydrochlorothiazide (HYDRODIURIL) tablet 25 mg (25 mg Oral Given 01/26/13 1357)    Patient Vitals for the past 24 hrs:  BP Temp Temp src Pulse Resp SpO2  01/26/13 1357 175/71 mmHg - - - - -  01/26/13 1335 175/71 mmHg - - 73 - 100 %  01/26/13 1126 - - - - - 100 %  01/26/13 1120 156/64 mmHg - - 72 16 100 %  01/26/13 1112 - - - 64 16 100 %  01/26/13 1110 156/66 mmHg - - - - -  01/26/13 0954 149/65 mmHg - - 69 - 100 %  01/26/13 0949 - 97.5 F (36.4 C) - - - -  01/26/13 0859 156/77 mmHg 98.9 F (37.2 C) Oral 75 20 100 %    13:25 AM Reevaluation with update and discussion. After initial assessment and treatment, an updated evaluation reveals she states the tingling from her lips is gone and she has persistent tingling in her hands bilaterally. Her usual blood pressure medicines are ordered, for persistent hypertension. Reyden Smith L    Labs Review Labs Reviewed  BASIC METABOLIC PANEL - Abnormal; Notable for the following:  Glucose, Bld 120 (*)    GFR calc non Af Amer 51 (*)    GFR calc Af Amer 60 (*)    All other components within normal limits   URINALYSIS, ROUTINE W REFLEX MICROSCOPIC - Abnormal; Notable for the following:    Leukocytes, UA SMALL (*)    All other components within normal limits  URINE CULTURE  CBC WITH DIFFERENTIAL  URINE MICROSCOPIC-ADD ON   Imaging Review Dg Chest 2 View  01/26/2013   CLINICAL DATA:  Tightness in chest.  EXAM: CHEST  2 VIEW  COMPARISON:  No priors.  FINDINGS: Lung volumes are normal. No consolidative airspace disease. No pleural effusions. No pneumothorax. No pulmonary nodule or mass noted. Pulmonary vasculature and the cardiomediastinal silhouette are within normal limits. Surgical clips in the right upper quadrant of the abdomen related to prior cholecystectomy. Status post left shoulder arthroplasty.  IMPRESSION: 1.  No radiographic evidence of acute cardiopulmonary disease.   Electronically Signed   By: Vinnie Langton M.D.   On: 01/26/2013 10:05    EKG Interpretation    Date/Time:  Wednesday January 26 2013 10:15:15 EST Ventricular Rate:  59 PR Interval:  160 QRS Duration: 90 QT Interval:  439 QTC Calculation: 435 R Axis:   -12 Text Interpretation:  Sinus rhythm Low voltage, extremity leads since last tracing no significant change Confirmed by Rosenda Geffrard  MD, Solenne Manwarren ([id number]) on 01/26/2013 10:39:15 AM            MDM   1. Tingling sensation   2. Hypertension    Nonspecific paresthesias, without evidence for CVA metabolic instability, or infectious process.   Nursing Notes Reviewed/ Care Coordinated Applicable Imaging Reviewed Interpretation of Laboratory Data incorporated into ED treatment  The patient appears reasonably screened and/or stabilized for discharge and I doubt any other medical condition or other [facility] requiring further screening, evaluation, or treatment in the ED at this time prior to discharge.  Plan: Home Medications- usual; Home Treatments- rest; return here if the recommended treatment, does not improve the symptoms; Recommended follow up- PCP, one week for  checkup     Richarda Blade, MD 01/26/13 [phone]

## 2013-01-26 NOTE — Discharge Instructions (Signed)
There was no apparent clear cause for your tingling sensation. Continue to take your medications, as usual. See, your doctor, for a checkup next week.     Paresthesia Paresthesia is an abnormal burning or prickling sensation. This sensation is generally felt in the hands, arms, legs, or feet. However, it may occur in any part of the body. It is usually not painful. The feeling may be described as:  Tingling or numbness.  "Pins and needles."  Skin crawling.  Buzzing.  Limbs "falling asleep."  Itching. Most people experience temporary (transient) paresthesia at some time in their lives. CAUSES  Paresthesia may occur when you breathe too quickly (hyperventilation). It can also occur without any apparent cause. Commonly, paresthesia occurs when pressure is placed on a nerve. The feeling quickly goes away once the pressure is removed. For some people, however, paresthesia is a long-lasting (chronic) condition caused by an underlying disorder. The underlying disorder may be:  A traumatic, direct injury to nerves. Examples include a:  Broken (fractured) neck.  Fractured skull.  A disorder affecting the brain and spinal cord (central nervous system). Examples include:  Transverse myelitis.  Encephalitis.  Transient ischemic attack.  Multiple sclerosis.  Stroke.  Tumor or blood vessel problems, such as an arteriovenous malformation pressing against the brain or spinal cord.  A condition that damages the peripheral nerves (peripheral neuropathy). Peripheral nerves are not part of the brain and spinal cord. These conditions include:  Diabetes.  Peripheral vascular disease.  Nerve entrapment syndromes, such as carpal tunnel syndrome.  Shingles.  Hypothyroidism.  Vitamin B12 deficiencies.  Alcoholism.  Heavy metal poisoning (lead, arsenic).  Rheumatoid arthritis.  Systemic lupus erythematosus. DIAGNOSIS  Your caregiver will attempt to find the underlying cause of  your paresthesia. Your caregiver may:  Take your medical history.  Perform a physical exam.  Order various lab tests.  Order imaging tests. TREATMENT  Treatment for paresthesia depends on the underlying cause. HOME CARE INSTRUCTIONS  Avoid drinking alcohol.  You may consider massage or acupuncture to help relieve your symptoms.  Keep all follow-up appointments as directed by your caregiver. SEEK IMMEDIATE MEDICAL CARE IF:   You feel weak.  You have trouble walking or moving.  You have problems with speech or vision.  You feel confused.  You cannot control your bladder or bowel movements.  You feel numbness after an injury.  You faint.  Your burning or prickling feeling gets worse when walking.  You have pain, cramps, or dizziness.  You develop a rash. MAKE SURE YOU:  Understand these instructions.  Will watch your condition.  Will get help right away if you are not doing well or get worse. Document Released: 12/13/2001 Document Revised: 03/17/2011 Document Reviewed: 09/13/2010 [facility] Patient Information 2014 [employer].  Hypertension Hypertension is another name for high blood pressure. High blood pressure may mean that your heart needs to work harder to pump blood. Blood pressure consists of two numbers, which includes a higher number over a lower number (example: 110/72). HOME CARE   Make lifestyle changes as told by your doctor. This may include weight loss and exercise.  Take your blood pressure medicine every day.  Limit how much salt you use.  Stop smoking if you smoke.  Do not use drugs.  Talk to your doctor if you are using decongestants or birth control pills. These medicines might make blood pressure higher.  Females should not drink more than 1 alcoholic drink per day. Males should not drink more than  2 alcoholic drinks per day.  See your doctor as told. GET HELP RIGHT AWAY IF:   You have a blood pressure reading with a top  number of 180 or higher.  You get a very bad headache.  You get blurred or changing vision.  You feel confused.  You feel weak, numb, or faint.  You get chest or belly (abdominal) pain.  You throw up (vomit).  You cannot breathe very well. MAKE SURE YOU:   Understand these instructions.  Will watch your condition.  Will get help right away if you are not doing well or get worse. Document Released: 06/11/2007 Document Revised: 03/17/2011 Document Reviewed: 06/11/2007 [facility] Patient Information 2014 [place], [place].

## 2013-01-26 NOTE — ED Notes (Signed)
Tolerating POs without difficulty 

## 2013-01-26 NOTE — ED Notes (Addendum)
Patient awoke this morning about 7am with numbness in her legs and lips. She was able to walk without difficulty. No other neurological deficits note per EMS. [facility]. 160/66, 74,18, 99% RA.

## 2013-01-27 LAB — URINE CULTURE
COLONY COUNT: NO GROWTH
CULTURE: NO GROWTH

## 2013-02-02 ENCOUNTER — Other Ambulatory Visit: Payer: Self-pay | Admitting: Gastroenterology

## 2013-02-02 DIAGNOSIS — R110 Nausea: Secondary | ICD-10-CM

## 2013-02-02 DIAGNOSIS — K429 Umbilical hernia without obstruction or gangrene: Secondary | ICD-10-CM | POA: Diagnosis not present

## 2013-02-03 ENCOUNTER — Ambulatory Visit
Admission: RE | Admit: 2013-02-03 | Discharge: 2013-02-03 | Disposition: A | Discharge status: Home / Self Care | Payer: Medicare Other | Source: Ambulatory Visit | Attending: Gastroenterology | Admitting: Gastroenterology

## 2013-02-03 ENCOUNTER — Other Ambulatory Visit: Payer: Self-pay | Admitting: Gastroenterology

## 2013-02-03 DIAGNOSIS — K449 Diaphragmatic hernia without obstruction or gangrene: Secondary | ICD-10-CM | POA: Diagnosis not present

## 2013-02-03 DIAGNOSIS — R110 Nausea: Secondary | ICD-10-CM

## 2013-02-28 DIAGNOSIS — N390 Urinary tract infection, site not specified: Secondary | ICD-10-CM | POA: Diagnosis not present

## 2013-07-11 DIAGNOSIS — Z79899 Other long term (current) drug therapy: Secondary | ICD-10-CM | POA: Diagnosis not present

## 2013-07-11 DIAGNOSIS — I129 Hypertensive chronic kidney disease with stage 1 through stage 4 chronic kidney disease, or unspecified chronic kidney disease: Secondary | ICD-10-CM | POA: Diagnosis not present

## 2013-07-11 DIAGNOSIS — E1149 Type 2 diabetes mellitus with other diabetic neurological complication: Secondary | ICD-10-CM | POA: Diagnosis not present

## 2013-07-11 DIAGNOSIS — G609 Hereditary and idiopathic neuropathy, unspecified: Secondary | ICD-10-CM | POA: Diagnosis not present

## 2013-07-11 DIAGNOSIS — N183 Chronic kidney disease, stage 3 unspecified: Secondary | ICD-10-CM | POA: Diagnosis not present

## 2013-07-11 DIAGNOSIS — E7800 Pure hypercholesterolemia, unspecified: Secondary | ICD-10-CM | POA: Diagnosis not present

## 2013-07-28 DIAGNOSIS — R51 Headache: Secondary | ICD-10-CM | POA: Diagnosis not present

## 2013-08-05 ENCOUNTER — Other Ambulatory Visit: Payer: Self-pay

## 2013-08-05 DIAGNOSIS — Z1231 Encounter for screening mammogram for malignant neoplasm of breast: Secondary | ICD-10-CM

## 2013-08-23 ENCOUNTER — Other Ambulatory Visit: Payer: Self-pay | Admitting: Geriatric Medicine

## 2013-08-23 DIAGNOSIS — Z23 Encounter for immunization: Secondary | ICD-10-CM | POA: Diagnosis not present

## 2013-08-23 DIAGNOSIS — M545 Low back pain, unspecified: Secondary | ICD-10-CM | POA: Diagnosis not present

## 2013-08-23 DIAGNOSIS — E1142 Type 2 diabetes mellitus with diabetic polyneuropathy: Secondary | ICD-10-CM | POA: Diagnosis not present

## 2013-08-23 DIAGNOSIS — I10 Essential (primary) hypertension: Secondary | ICD-10-CM | POA: Diagnosis not present

## 2013-08-25 ENCOUNTER — Ambulatory Visit
Admission: RE | Admit: 2013-08-25 | Discharge: 2013-08-25 | Disposition: A | Discharge status: Home / Self Care | Payer: Medicare Other | Source: Ambulatory Visit | Attending: Geriatric Medicine | Admitting: Geriatric Medicine

## 2013-08-25 DIAGNOSIS — M545 Low back pain, unspecified: Secondary | ICD-10-CM

## 2013-08-25 DIAGNOSIS — M48061 Spinal stenosis, lumbar region without neurogenic claudication: Secondary | ICD-10-CM | POA: Diagnosis not present

## 2013-08-25 IMAGING — US US THYROID BIOPSY
1 series · 13 of 15 positions shown · non-contrast
Comparison: Prior thyroid ultrasound 08/02/2012

CLINICAL DATA: 87-year-old female with a dominant predominately
solid nodule in the right thyroid gland which meets consensus
criteria for ultrasound-guided fine needle aspiration biopsy.

ULTRASOUND GUIDED NEEDLE ASPIRATE BIOPSY OF THE THYROID GLAND

[Series 1: us thyroid biopsy · 0.06mm/px · 15 acquisitions, 13 frames shown]
[im 1/15]
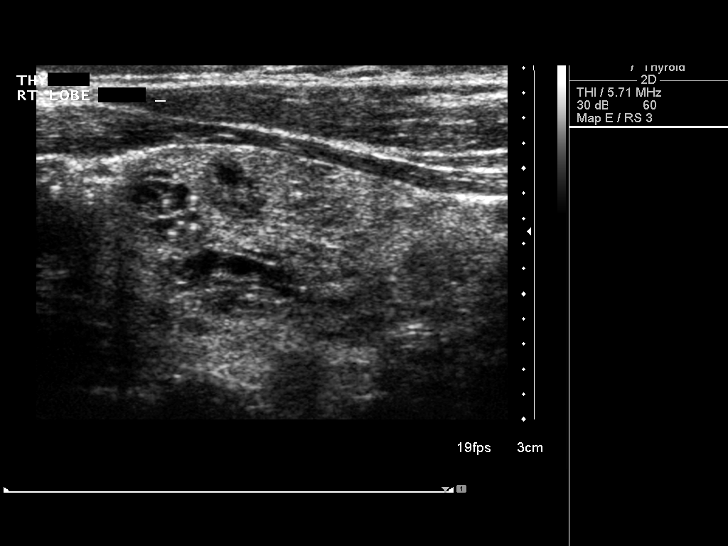
[im 2/15]
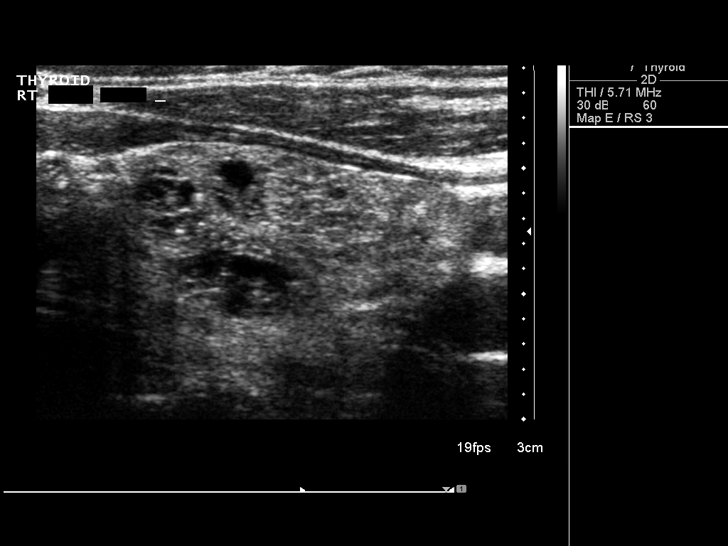
[im 3/15]
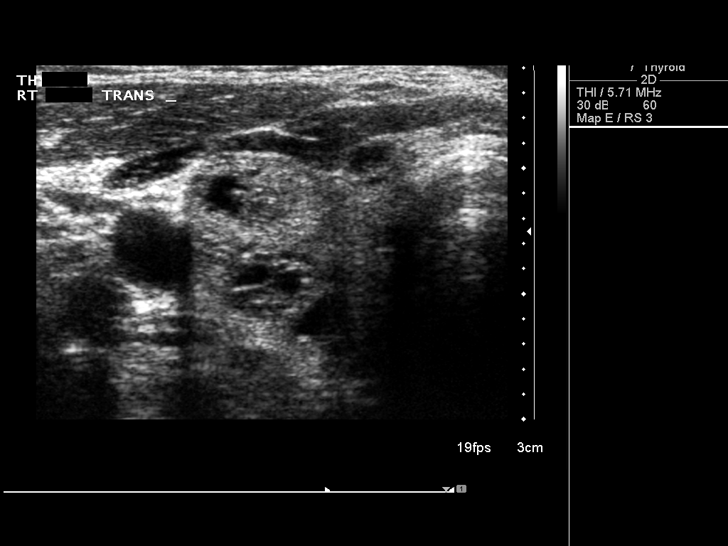
[im 5/15]
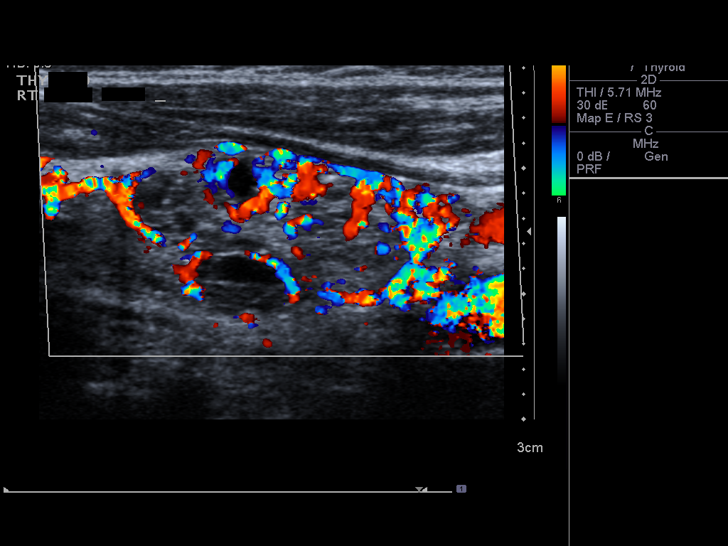
[im 6/15]
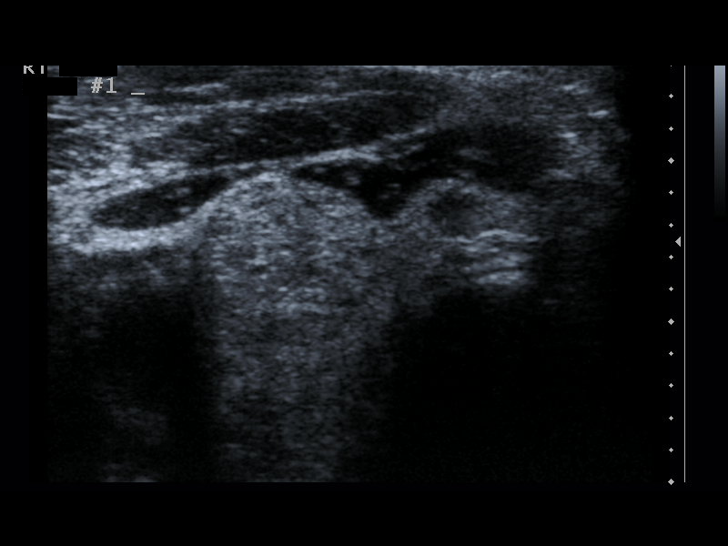
[im 7/15]
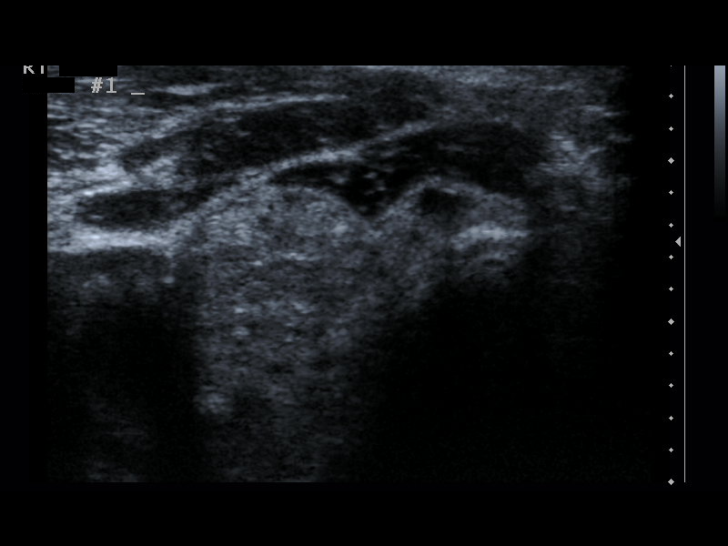
[im 8/15]
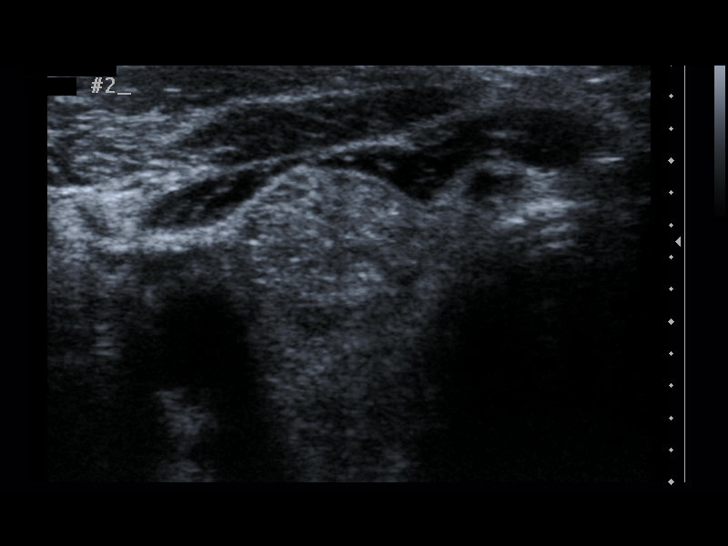
[im 9/15]
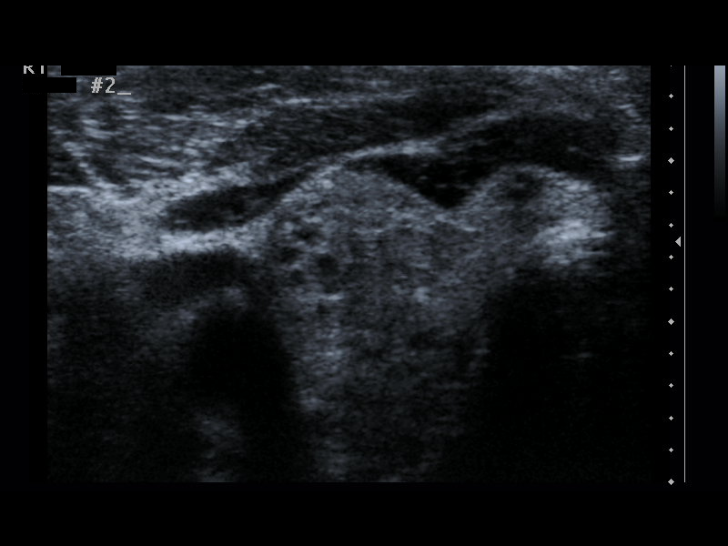
[im 10/15]
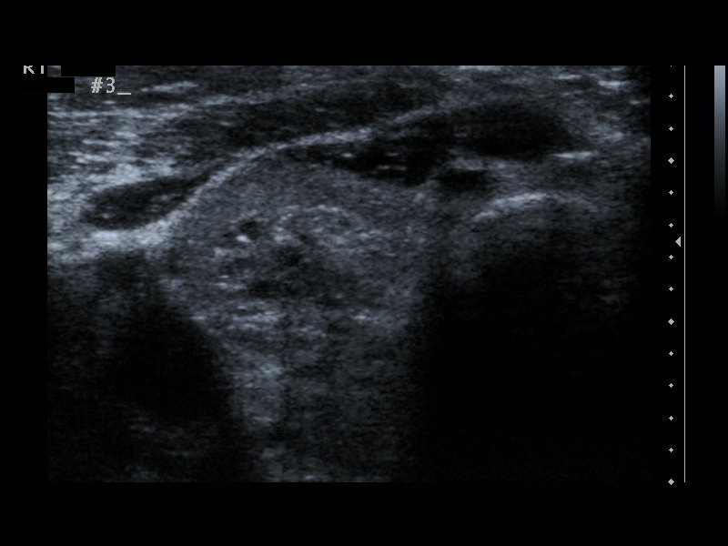
[im 11/15]
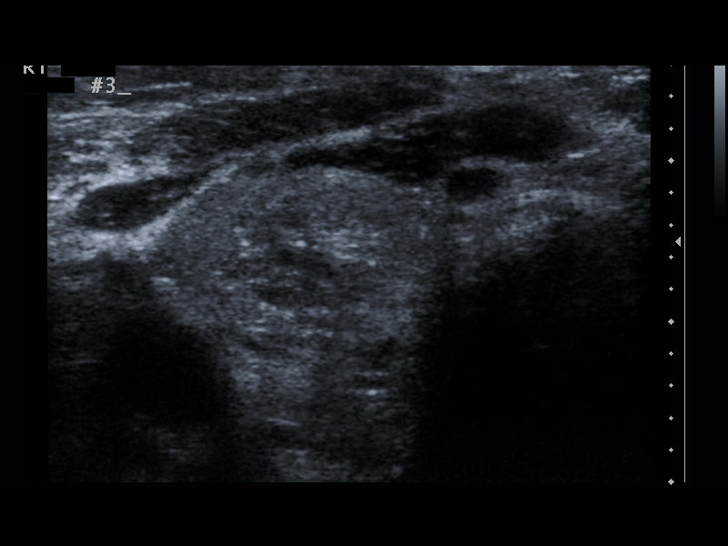
[im 13/15]
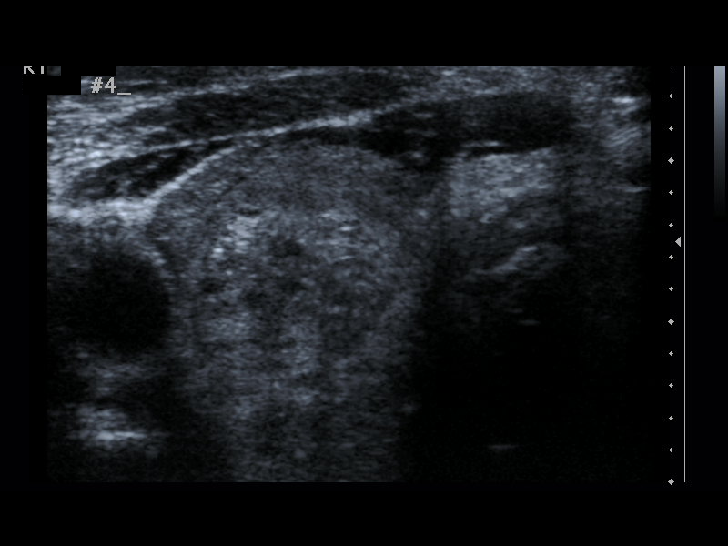
[im 14/15]
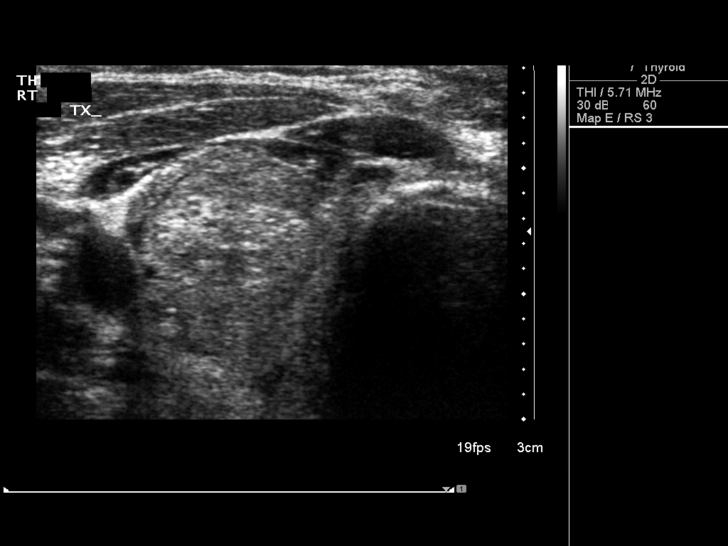
[im 15/15]
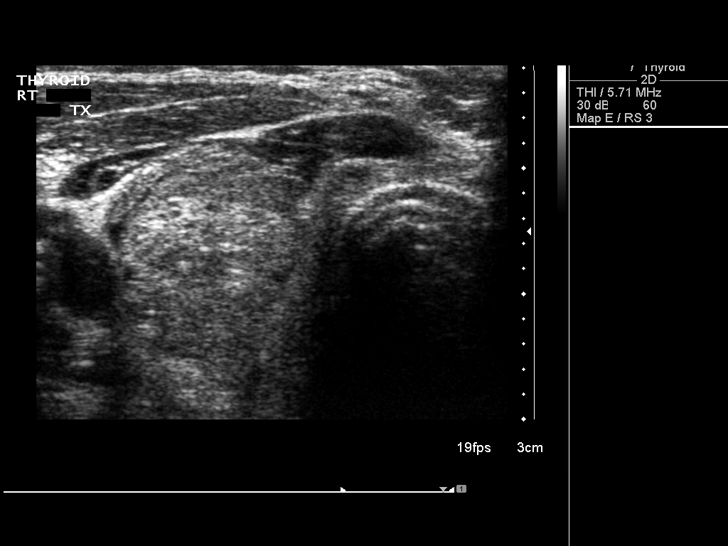

[13 of 15 positions shown; findings below may reference images not displayed]

Thyroid biopsy was thoroughly discussed with the patient and
questions were answered.  The benefits, risks, alternatives, and
complications were also discussed.  The patient understands and
wishes to proceed with the procedure.  Written consent was
obtained.

Ultrasound was performed to localize and mark an adequate site for
the biopsy.  The patient was then prepped and draped in a normal
sterile fashion.  Local anesthesia was provided with 1% lidocaine.
Using direct ultrasound guidance, 4 passes were made using 25 gauge
needles into the nodule within the right lobe of the thyroid.
Ultrasound was used to confirm needle placements on all occasions.
Specimens were sent to Pathology for analysis.

Complications:  None
FINDINGS: Successful identification of predominantly solid complex
nodule in the right mid thyroid gland.
IMPRESSION: Ultrasound guided needle aspirate biopsy performed of the right
thyroid nodule.

## 2013-09-05 ENCOUNTER — Ambulatory Visit
Admission: RE | Admit: 2013-09-05 | Discharge: 2013-09-05 | Disposition: A | Discharge status: Home / Self Care | Payer: Medicare Other | Source: Ambulatory Visit

## 2013-09-05 DIAGNOSIS — Z1231 Encounter for screening mammogram for malignant neoplasm of breast: Secondary | ICD-10-CM | POA: Diagnosis not present

## 2013-09-19 DIAGNOSIS — I129 Hypertensive chronic kidney disease with stage 1 through stage 4 chronic kidney disease, or unspecified chronic kidney disease: Secondary | ICD-10-CM | POA: Diagnosis not present

## 2013-09-19 DIAGNOSIS — N183 Chronic kidney disease, stage 3 unspecified: Secondary | ICD-10-CM | POA: Diagnosis not present

## 2013-09-19 DIAGNOSIS — E1142 Type 2 diabetes mellitus with diabetic polyneuropathy: Secondary | ICD-10-CM | POA: Diagnosis not present

## 2013-09-28 DIAGNOSIS — E1149 Type 2 diabetes mellitus with other diabetic neurological complication: Secondary | ICD-10-CM | POA: Diagnosis not present

## 2013-09-28 DIAGNOSIS — E1142 Type 2 diabetes mellitus with diabetic polyneuropathy: Secondary | ICD-10-CM | POA: Diagnosis not present

## 2013-09-28 DIAGNOSIS — Z1331 Encounter for screening for depression: Secondary | ICD-10-CM | POA: Diagnosis not present

## 2013-09-28 DIAGNOSIS — E669 Obesity, unspecified: Secondary | ICD-10-CM | POA: Diagnosis not present

## 2013-09-28 DIAGNOSIS — I129 Hypertensive chronic kidney disease with stage 1 through stage 4 chronic kidney disease, or unspecified chronic kidney disease: Secondary | ICD-10-CM | POA: Diagnosis not present

## 2013-09-28 DIAGNOSIS — Z0000 Encounter for general adult medical examination without abnormal findings: Secondary | ICD-10-CM | POA: Diagnosis not present

## 2013-09-28 DIAGNOSIS — N183 Chronic kidney disease, stage 3 unspecified: Secondary | ICD-10-CM | POA: Diagnosis not present

## 2013-09-28 DIAGNOSIS — E7800 Pure hypercholesterolemia, unspecified: Secondary | ICD-10-CM | POA: Diagnosis not present

## 2013-12-28 DIAGNOSIS — M545 Low back pain: Secondary | ICD-10-CM | POA: Diagnosis not present

## 2013-12-28 DIAGNOSIS — M1712 Unilateral primary osteoarthritis, left knee: Secondary | ICD-10-CM | POA: Diagnosis not present

## 2013-12-28 DIAGNOSIS — M25562 Pain in left knee: Secondary | ICD-10-CM | POA: Diagnosis not present

## 2013-12-28 DIAGNOSIS — G629 Polyneuropathy, unspecified: Secondary | ICD-10-CM | POA: Diagnosis not present

## 2014-01-12 NOTE — Telephone Encounter (Signed)
Patient never picked up Rx of Lidocaine dated 11/09/12.  The Rx is Void after 90 days and has been placed in the shred ben.

## 2014-01-25 DIAGNOSIS — N183 Chronic kidney disease, stage 3 (moderate): Secondary | ICD-10-CM | POA: Diagnosis not present

## 2014-01-25 DIAGNOSIS — E669 Obesity, unspecified: Secondary | ICD-10-CM | POA: Diagnosis not present

## 2014-01-25 DIAGNOSIS — I129 Hypertensive chronic kidney disease with stage 1 through stage 4 chronic kidney disease, or unspecified chronic kidney disease: Secondary | ICD-10-CM | POA: Diagnosis not present

## 2014-01-25 DIAGNOSIS — Z6838 Body mass index (BMI) 38.0-38.9, adult: Secondary | ICD-10-CM | POA: Diagnosis not present

## 2014-01-25 DIAGNOSIS — E780 Pure hypercholesterolemia: Secondary | ICD-10-CM | POA: Diagnosis not present

## 2014-01-25 DIAGNOSIS — E1121 Type 2 diabetes mellitus with diabetic nephropathy: Secondary | ICD-10-CM | POA: Diagnosis not present

## 2014-01-25 DIAGNOSIS — E1140 Type 2 diabetes mellitus with diabetic neuropathy, unspecified: Secondary | ICD-10-CM | POA: Diagnosis not present

## 2014-01-25 DIAGNOSIS — Z79899 Other long term (current) drug therapy: Secondary | ICD-10-CM | POA: Diagnosis not present

## 2014-02-10 IMAGING — CR DG CHEST 2V
2 series · 2 of 2 positions shown · non-contrast
Comparison: No priors.

CLINICAL DATA: Tightness in chest.

EXAM:
CHEST  2 VIEW

[w chest pa]
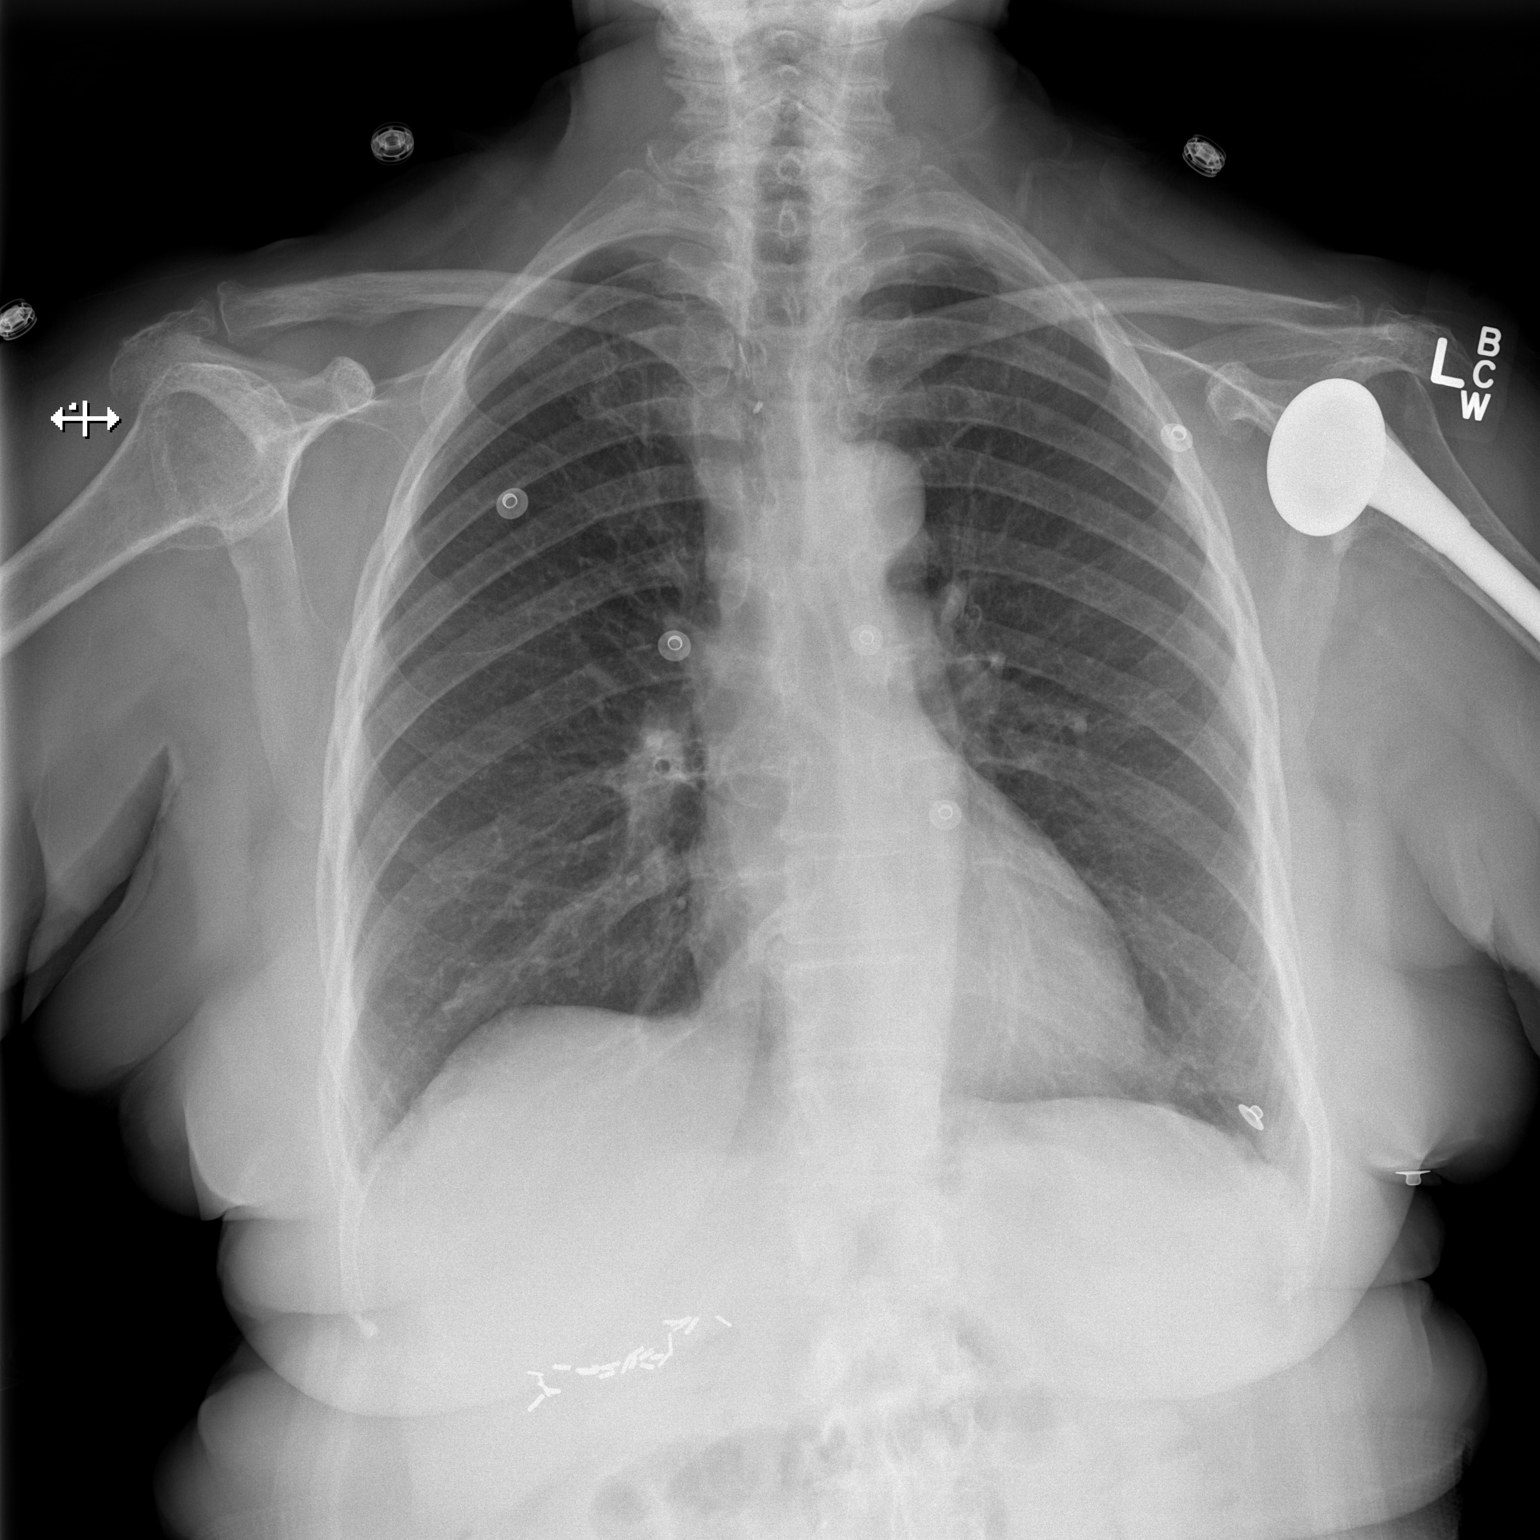

[w chest lat]
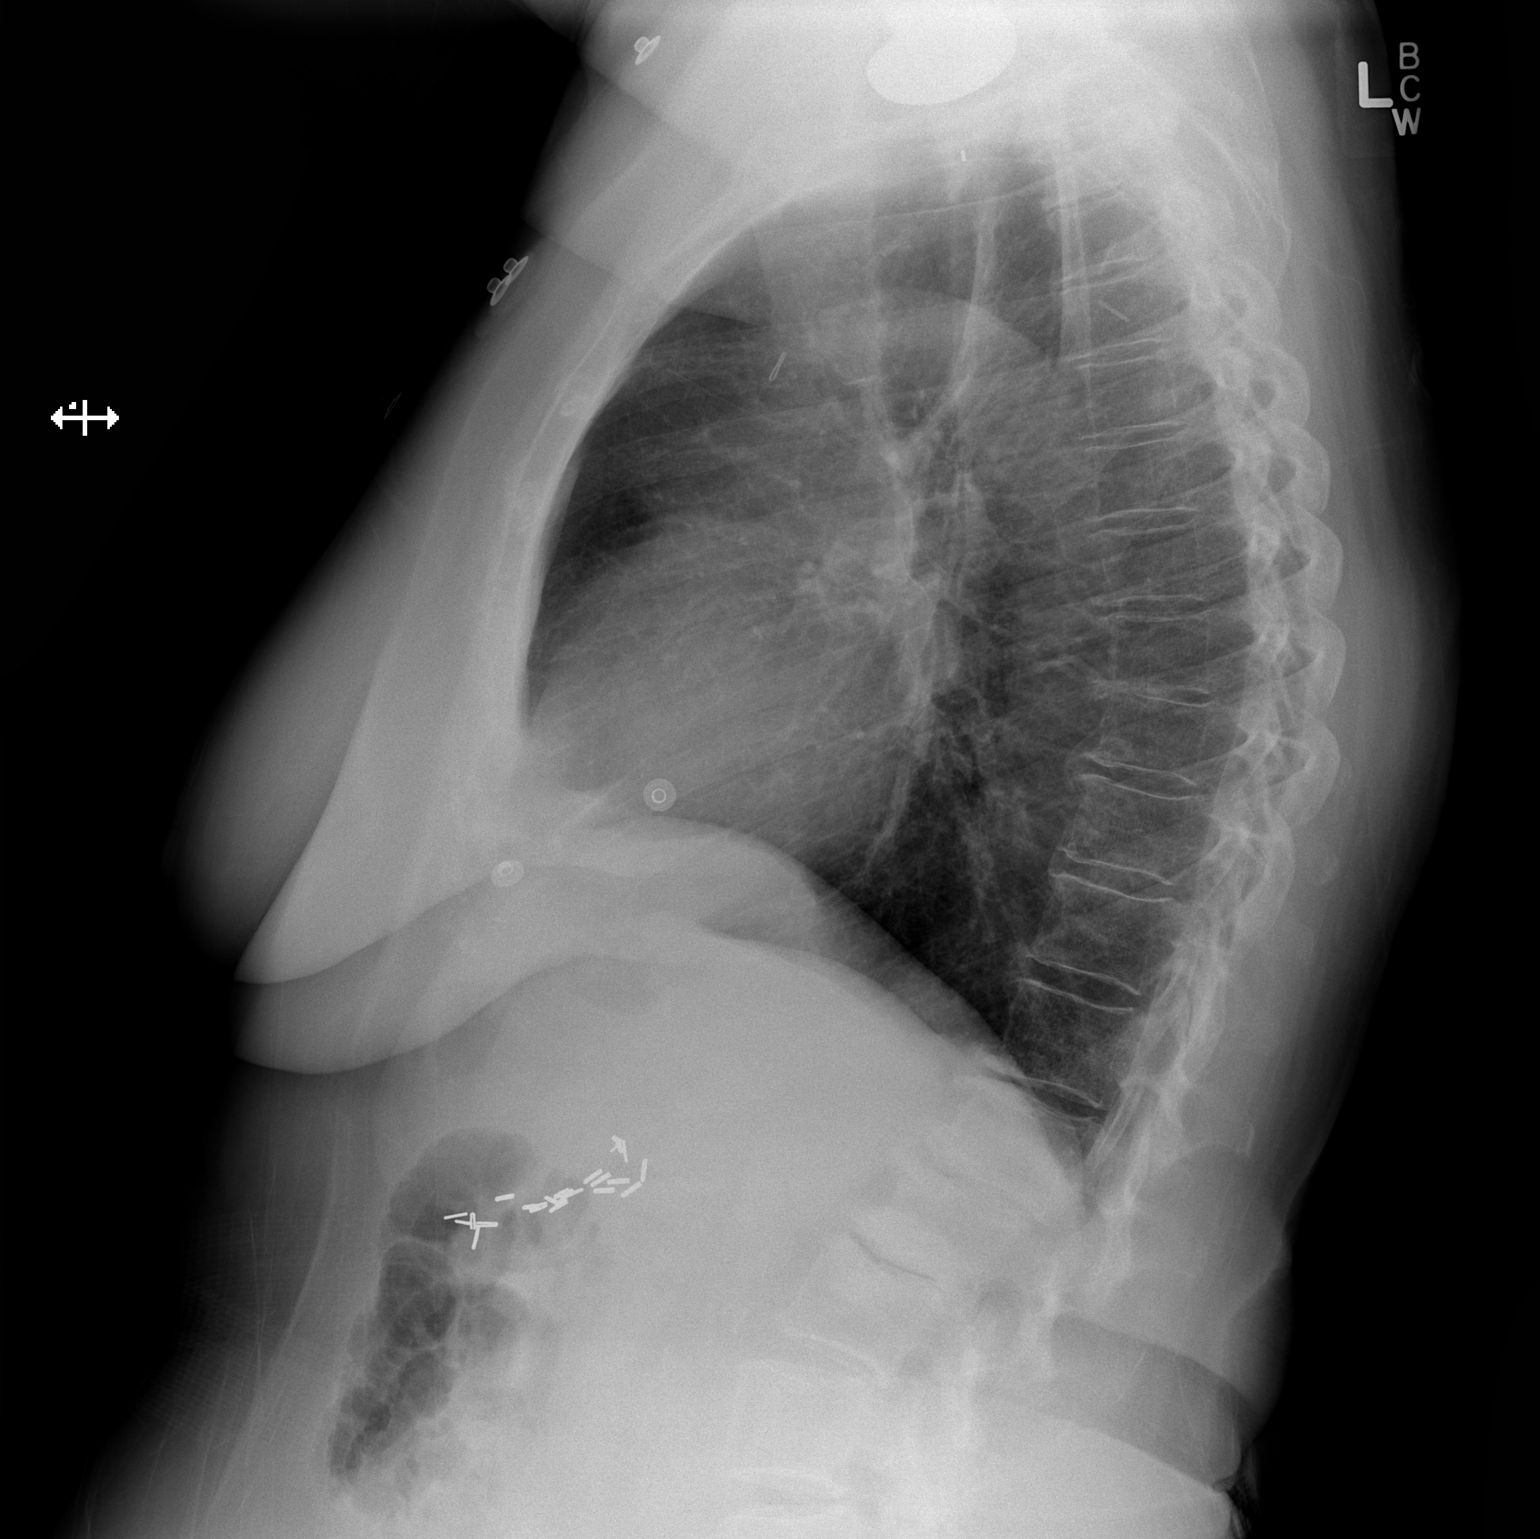

[2 of 2 positions shown; findings below may reference images not displayed]

FINDINGS: Lung volumes are normal. No consolidative airspace disease. No
pleural effusions. No pneumothorax. No pulmonary nodule or mass
noted. Pulmonary vasculature and the cardiomediastinal silhouette
are within normal limits. Surgical clips in the right upper quadrant
of the abdomen related to prior cholecystectomy. Status post left
shoulder arthroplasty.
IMPRESSION: 1.  No radiographic evidence of acute cardiopulmonary disease.

## 2014-06-14 DIAGNOSIS — R300 Dysuria: Secondary | ICD-10-CM | POA: Diagnosis not present

## 2014-07-14 ENCOUNTER — Other Ambulatory Visit (HOSPITAL_COMMUNITY): Payer: Self-pay | Admitting: Gastroenterology

## 2014-07-14 DIAGNOSIS — R110 Nausea: Secondary | ICD-10-CM

## 2014-08-03 ENCOUNTER — Ambulatory Visit (HOSPITAL_COMMUNITY)
Admission: RE | Admit: 2014-08-03 | Discharge: 2014-08-03 | Disposition: A | Discharge status: Home / Self Care | Payer: Medicare Other | Source: Ambulatory Visit | Attending: Gastroenterology | Admitting: Gastroenterology

## 2014-08-03 DIAGNOSIS — R110 Nausea: Secondary | ICD-10-CM

## 2014-08-03 DIAGNOSIS — R112 Nausea with vomiting, unspecified: Secondary | ICD-10-CM | POA: Insufficient documentation

## 2014-09-02 ENCOUNTER — Ambulatory Visit (INDEPENDENT_AMBULATORY_CARE_PROVIDER_SITE_OTHER): Payer: Medicare Other | Admitting: Family Medicine

## 2014-09-02 VITALS — BP 148/76 | HR 94 | Temp 97.4°F | Resp 18 | Ht 60.0 in | Wt 174.0 lb

## 2014-09-02 DIAGNOSIS — R300 Dysuria: Secondary | ICD-10-CM | POA: Diagnosis not present

## 2014-09-02 LAB — POCT UA - MICROSCOPIC ONLY
CRYSTALS, UR, HPF, POC: NEGATIVE
RBC, URINE, MICROSCOPIC: NEGATIVE
Yeast, UA: NEGATIVE

## 2014-09-02 LAB — POCT URINALYSIS DIPSTICK
Bilirubin, UA: NEGATIVE
GLUCOSE UA: NEGATIVE
Ketones, UA: NEGATIVE
NITRITE UA: NEGATIVE
PH UA: 5.5
Protein, UA: NEGATIVE
RBC UA: NEGATIVE
Spec Grav, UA: 1.01
UROBILINOGEN UA: 0.2

## 2014-09-02 MED ORDER — CIPROFLOXACIN HCL 500 MG PO TABS: 500.0000 mg | ORAL_TABLET | Freq: Two times a day (BID) | ORAL | Status: AC

## 2014-09-02 NOTE — Progress Notes (Addendum)
@UMFCLOGO @  This chart was scribed for Ariel Haber, MD by Ariel Rogers, ED Scribe. This patient was seen in room 11 and the patient's care was started at 2:00 PM.  Patient ID: Ariel Rogers MRN: [id number], DOB: 04-16-1924, 81 y.o. Date of Encounter: 09/02/2014, 2:07 PM  Primary Physician: Mathews Argyle, MD  Chief Complaint:  Chief Complaint  Patient presents with  . Dysuria    x4 days   . Urinary Frequency    HPI: 81 y.o. year old female presents with 3-4 day history of dysuria and frequency. Pt believes she has a kidney infection. Pt has chronic back pain. No hematuria, fever and chills. LMP: hx of hysterectomy No sick contacts, recent antibiotics, or recent travels.   Past Medical History  Diagnosis Date  . Diabetes   . High cholesterol   . Hypertension   . Fibromyalgia   . Hyperhidrosis   . Renal disease   . Diverticulosis   . Neuropathy     in back and legs     Home Meds: Prior to Admission medications   Medication Sig Start Date End Date Taking? Authorizing Provider  acetaminophen (TYLENOL) 500 MG tablet Take 500 mg by mouth at bedtime as needed (sleep, takes along with the sleep aid).   Yes Historical Provider, MD  amitriptyline (ELAVIL) 50 MG tablet Take 50 mg by mouth at bedtime as needed for sleep. 1/2 tab once daily   Yes Historical Provider, MD  cloNIDine (CATAPRES) 0.2 MG tablet Take 0.2 mg by mouth 2 (two) times daily.   Yes Historical Provider, MD  doxylamine, Sleep, (SLEEP AID) 25 MG tablet Take 25 mg by mouth at bedtime as needed.   Yes Historical Provider, MD  GABAPENTIN PO Take by mouth.   Yes Historical Provider, MD  hydrochlorothiazide (HYDRODIURIL) 25 MG tablet Take 25 mg by mouth daily.   Yes Historical Provider, MD  lidocaine (LIDODERM) 5 % Place 1 patch onto the skin daily. Remove & Discard patch within 12 hours or as directed by MD 11/09/12  Yes Penni Bombard, MD  lovastatin (ALTOPREV) 40 MG 24 hr tablet Take 40 mg by mouth at  bedtime.   Yes Historical Provider, MD  Multiple Vitamins-Minerals (MULTIVITAMIN WITH MINERALS) tablet Take 1 tablet by mouth daily.   Yes Historical Provider, MD  multivitamin (METANX) 3-35-2 MG TABS tablet Take 1 tablet by mouth 2 (two) times daily.   Yes Historical Provider, MD  Omega-3 Fatty Acids (FISH OIL) 1000 MG CAPS Take 1 capsule by mouth 2 (two) times daily.   Yes Historical Provider, MD  pantoprazole (PROTONIX) 40 MG tablet Take 40 mg by mouth daily.   Yes Historical Provider, MD  pioglitazone (ACTOS) 30 MG tablet Take 30 mg by mouth daily.   Yes Historical Provider, MD  vitamin D, CHOLECALCIFEROL, 400 UNITS tablet Take 400 Units by mouth daily.   Yes Historical Provider, MD    Allergies:  Allergies  Allergen Reactions  . Caffeine   . Codeine     Social History   Social History  . Marital Status: Widowed    Spouse Name: N/A  . Number of Children: 2  . Years of Education: N/A   Occupational History  . retired     Airline pilot    Social History Main Topics  . Smoking status: Never Smoker   . Smokeless tobacco: Not on file  . Alcohol Use: No  . Drug Use: No  . Sexual Activity: Not on file  Other Topics Concern  . Not on file   Social History Narrative   Patient lives in [facility] independent living.    Caffeine Use: none     Review of Systems: Constitutional: negative for chills, fever, night sweats or weight changes Cardiovascular: negative for chest pain or palpitations Respiratory: negative for hemoptysis, wheezing, or shortness of breath Abdominal: negative for abdominal pain, nausea, vomiting or diarrhea Dermatological: negative for rash Neurologic: negative for headache   Physical Exam: Blood pressure 148/76, pulse 94, temperature 97.4 F (36.3 C), temperature source Oral, resp. rate 18, height 5' (1.524 m), weight 174 lb (78.926 kg), SpO2 97 %., Body mass index is 33.98 kg/(m^2). General: Well developed, well nourished,  in no acute distress. Head: Normocephalic, atraumatic, eyes without discharge, sclera non-icteric, nares are congested. Bilateral auditory canals clear, TM's are without perforation, pearly grey with reflective cone of light bilaterally. Serous effusion bilaterally behind TM's. Maxillary sinus TTP. Oral cavity moist, dentition normal. Posterior pharynx with post nasal drip and mild erythema. No peritonsillar abscess or tonsillar exudate. Neck: Supple. No thyromegaly. Full ROM. No lymphadenopathy. Lungs: Coarse breath sounds bilaterally without Clear bilaterally to auscultation without wheezes, rales, or rhonchi. Breathing is unlabored.  Heart: RRR with S1 S2. No murmurs, rubs, or gallops appreciated. Abdomen: Soft, non-tender, non-distended with normoactive bowel sounds. No hepatosplenomegaly. No rebound/guarding. No obvious abdominal masses. McBurney's, Rovsing's, Iliopsoas, and table jar all negative. Msk:  Strength and tone normal for age. Extremities: No clubbing or cyanosis. No edema. Neuro: Alert and oriented X 3. Moves all extremities spontaneously. CNII-XII grossly in tact. Psych:  Responds to questions appropriately with a normal affect.   Labs: Results for orders placed or performed in visit on 09/02/14  POCT urinalysis dipstick  Result Value Ref Range   Color, UA yellow    Clarity, UA clear    Glucose, UA negative    Bilirubin, UA negative    Ketones, UA negative    Spec Grav, UA 1.010    Blood, UA negative    pH, UA 5.5    Protein, UA negative    Urobilinogen, UA 0.2    Nitrite, UA negative    Leukocytes, UA small (1+) (A) Negative  POCT UA - Microscopic Only  Result Value Ref Range   WBC, Ur, HPF, POC 5-10    RBC, urine, microscopic Negative    Bacteria, U Microscopic 1+    Mucus, UA Small    Epithelial cells, urine per micros 1-5    Crystals, Ur, HPF, POC Negative    Casts, Ur, LPF, POC Hyaline    Yeast, UA Negative       ASSESSMENT AND PLAN:  81 y.o. year old  female with  -   ICD-9-CM ICD-10-CM   1. Dysuria 788.1 R30.0 POCT urinalysis dipstick     POCT UA - Microscopic Only     Urine culture     ciprofloxacin (CIPRO) 500 MG tablet   -Tylenol/Motrin prn -Rest/fluids -RTC precautions -RTC 3-5 days if no improvement  Signed, Ariel Haber, MD 09/02/2014 2:07 PM

## 2014-09-03 LAB — URINE CULTURE: Colony Count: 45000

## 2014-09-12 DIAGNOSIS — R410 Disorientation, unspecified: Secondary | ICD-10-CM | POA: Diagnosis not present

## 2014-10-19 DIAGNOSIS — E669 Obesity, unspecified: Secondary | ICD-10-CM | POA: Diagnosis not present

## 2014-10-19 DIAGNOSIS — R110 Nausea: Secondary | ICD-10-CM | POA: Diagnosis not present

## 2014-10-19 DIAGNOSIS — Z0000 Encounter for general adult medical examination without abnormal findings: Secondary | ICD-10-CM | POA: Diagnosis not present

## 2014-10-19 DIAGNOSIS — R5381 Other malaise: Secondary | ICD-10-CM | POA: Diagnosis not present

## 2014-10-19 DIAGNOSIS — Z79899 Other long term (current) drug therapy: Secondary | ICD-10-CM | POA: Diagnosis not present

## 2014-10-19 DIAGNOSIS — I129 Hypertensive chronic kidney disease with stage 1 through stage 4 chronic kidney disease, or unspecified chronic kidney disease: Secondary | ICD-10-CM | POA: Diagnosis not present

## 2014-10-19 DIAGNOSIS — N183 Chronic kidney disease, stage 3 (moderate): Secondary | ICD-10-CM | POA: Diagnosis not present

## 2014-10-19 DIAGNOSIS — E1121 Type 2 diabetes mellitus with diabetic nephropathy: Secondary | ICD-10-CM | POA: Diagnosis not present

## 2014-10-19 DIAGNOSIS — E1140 Type 2 diabetes mellitus with diabetic neuropathy, unspecified: Secondary | ICD-10-CM | POA: Diagnosis not present

## 2014-10-19 DIAGNOSIS — Z6834 Body mass index (BMI) 34.0-34.9, adult: Secondary | ICD-10-CM | POA: Diagnosis not present

## 2014-10-19 DIAGNOSIS — F325 Major depressive disorder, single episode, in full remission: Secondary | ICD-10-CM | POA: Diagnosis not present

## 2015-01-15 DIAGNOSIS — I129 Hypertensive chronic kidney disease with stage 1 through stage 4 chronic kidney disease, or unspecified chronic kidney disease: Secondary | ICD-10-CM | POA: Diagnosis not present

## 2015-01-15 DIAGNOSIS — E1140 Type 2 diabetes mellitus with diabetic neuropathy, unspecified: Secondary | ICD-10-CM | POA: Diagnosis not present

## 2015-01-15 DIAGNOSIS — R413 Other amnesia: Secondary | ICD-10-CM | POA: Diagnosis not present

## 2015-01-15 DIAGNOSIS — N183 Chronic kidney disease, stage 3 (moderate): Secondary | ICD-10-CM | POA: Diagnosis not present

## 2015-01-15 DIAGNOSIS — G301 Alzheimer's disease with late onset: Secondary | ICD-10-CM | POA: Diagnosis not present

## 2015-01-15 DIAGNOSIS — E1121 Type 2 diabetes mellitus with diabetic nephropathy: Secondary | ICD-10-CM | POA: Diagnosis not present

## 2015-01-16 DIAGNOSIS — R4181 Age-related cognitive decline: Secondary | ICD-10-CM | POA: Diagnosis not present

## 2015-01-16 DIAGNOSIS — R278 Other lack of coordination: Secondary | ICD-10-CM | POA: Diagnosis not present

## 2015-01-16 DIAGNOSIS — E119 Type 2 diabetes mellitus without complications: Secondary | ICD-10-CM | POA: Diagnosis not present

## 2015-01-16 DIAGNOSIS — Z79899 Other long term (current) drug therapy: Secondary | ICD-10-CM | POA: Diagnosis not present

## 2015-01-16 DIAGNOSIS — M6281 Muscle weakness (generalized): Secondary | ICD-10-CM | POA: Diagnosis not present

## 2015-01-17 DIAGNOSIS — M6281 Muscle weakness (generalized): Secondary | ICD-10-CM | POA: Diagnosis not present

## 2015-01-17 DIAGNOSIS — R4181 Age-related cognitive decline: Secondary | ICD-10-CM | POA: Diagnosis not present

## 2015-01-17 DIAGNOSIS — R278 Other lack of coordination: Secondary | ICD-10-CM | POA: Diagnosis not present

## 2015-01-18 DIAGNOSIS — R278 Other lack of coordination: Secondary | ICD-10-CM | POA: Diagnosis not present

## 2015-01-18 DIAGNOSIS — M6281 Muscle weakness (generalized): Secondary | ICD-10-CM | POA: Diagnosis not present

## 2015-01-18 DIAGNOSIS — R4181 Age-related cognitive decline: Secondary | ICD-10-CM | POA: Diagnosis not present

## 2015-01-19 DIAGNOSIS — R278 Other lack of coordination: Secondary | ICD-10-CM | POA: Diagnosis not present

## 2015-01-19 DIAGNOSIS — R4181 Age-related cognitive decline: Secondary | ICD-10-CM | POA: Diagnosis not present

## 2015-01-19 DIAGNOSIS — M6281 Muscle weakness (generalized): Secondary | ICD-10-CM | POA: Diagnosis not present

## 2015-01-22 DIAGNOSIS — R278 Other lack of coordination: Secondary | ICD-10-CM | POA: Diagnosis not present

## 2015-01-22 DIAGNOSIS — M6281 Muscle weakness (generalized): Secondary | ICD-10-CM | POA: Diagnosis not present

## 2015-01-22 DIAGNOSIS — R4181 Age-related cognitive decline: Secondary | ICD-10-CM | POA: Diagnosis not present

## 2015-01-23 DIAGNOSIS — R4181 Age-related cognitive decline: Secondary | ICD-10-CM | POA: Diagnosis not present

## 2015-01-23 DIAGNOSIS — M6281 Muscle weakness (generalized): Secondary | ICD-10-CM | POA: Diagnosis not present

## 2015-01-23 DIAGNOSIS — R278 Other lack of coordination: Secondary | ICD-10-CM | POA: Diagnosis not present

## 2015-01-24 DIAGNOSIS — M6281 Muscle weakness (generalized): Secondary | ICD-10-CM | POA: Diagnosis not present

## 2015-01-24 DIAGNOSIS — R278 Other lack of coordination: Secondary | ICD-10-CM | POA: Diagnosis not present

## 2015-01-24 DIAGNOSIS — R4181 Age-related cognitive decline: Secondary | ICD-10-CM | POA: Diagnosis not present

## 2015-01-25 DIAGNOSIS — R278 Other lack of coordination: Secondary | ICD-10-CM | POA: Diagnosis not present

## 2015-01-25 DIAGNOSIS — M6281 Muscle weakness (generalized): Secondary | ICD-10-CM | POA: Diagnosis not present

## 2015-01-25 DIAGNOSIS — R4181 Age-related cognitive decline: Secondary | ICD-10-CM | POA: Diagnosis not present

## 2015-01-26 DIAGNOSIS — R278 Other lack of coordination: Secondary | ICD-10-CM | POA: Diagnosis not present

## 2015-01-26 DIAGNOSIS — R4181 Age-related cognitive decline: Secondary | ICD-10-CM | POA: Diagnosis not present

## 2015-01-26 DIAGNOSIS — M6281 Muscle weakness (generalized): Secondary | ICD-10-CM | POA: Diagnosis not present

## 2015-01-29 DIAGNOSIS — M6281 Muscle weakness (generalized): Secondary | ICD-10-CM | POA: Diagnosis not present

## 2015-01-29 DIAGNOSIS — R4181 Age-related cognitive decline: Secondary | ICD-10-CM | POA: Diagnosis not present

## 2015-01-29 DIAGNOSIS — R278 Other lack of coordination: Secondary | ICD-10-CM | POA: Diagnosis not present

## 2015-01-30 DIAGNOSIS — R4181 Age-related cognitive decline: Secondary | ICD-10-CM | POA: Diagnosis not present

## 2015-01-30 DIAGNOSIS — R278 Other lack of coordination: Secondary | ICD-10-CM | POA: Diagnosis not present

## 2015-01-30 DIAGNOSIS — M6281 Muscle weakness (generalized): Secondary | ICD-10-CM | POA: Diagnosis not present

## 2015-01-31 DIAGNOSIS — R278 Other lack of coordination: Secondary | ICD-10-CM | POA: Diagnosis not present

## 2015-01-31 DIAGNOSIS — R4181 Age-related cognitive decline: Secondary | ICD-10-CM | POA: Diagnosis not present

## 2015-01-31 DIAGNOSIS — M6281 Muscle weakness (generalized): Secondary | ICD-10-CM | POA: Diagnosis not present

## 2015-02-01 DIAGNOSIS — R4181 Age-related cognitive decline: Secondary | ICD-10-CM | POA: Diagnosis not present

## 2015-02-01 DIAGNOSIS — R278 Other lack of coordination: Secondary | ICD-10-CM | POA: Diagnosis not present

## 2015-02-01 DIAGNOSIS — M6281 Muscle weakness (generalized): Secondary | ICD-10-CM | POA: Diagnosis not present

## 2015-02-02 DIAGNOSIS — R278 Other lack of coordination: Secondary | ICD-10-CM | POA: Diagnosis not present

## 2015-02-02 DIAGNOSIS — M25572 Pain in left ankle and joints of left foot: Secondary | ICD-10-CM | POA: Diagnosis not present

## 2015-02-02 DIAGNOSIS — M6281 Muscle weakness (generalized): Secondary | ICD-10-CM | POA: Diagnosis not present

## 2015-02-02 DIAGNOSIS — R4181 Age-related cognitive decline: Secondary | ICD-10-CM | POA: Diagnosis not present

## 2015-02-03 DIAGNOSIS — R4181 Age-related cognitive decline: Secondary | ICD-10-CM | POA: Diagnosis not present

## 2015-02-03 DIAGNOSIS — R278 Other lack of coordination: Secondary | ICD-10-CM | POA: Diagnosis not present

## 2015-02-03 DIAGNOSIS — M6281 Muscle weakness (generalized): Secondary | ICD-10-CM | POA: Diagnosis not present

## 2015-02-05 DIAGNOSIS — M6281 Muscle weakness (generalized): Secondary | ICD-10-CM | POA: Diagnosis not present

## 2015-02-05 DIAGNOSIS — R4181 Age-related cognitive decline: Secondary | ICD-10-CM | POA: Diagnosis not present

## 2015-02-05 DIAGNOSIS — R278 Other lack of coordination: Secondary | ICD-10-CM | POA: Diagnosis not present

## 2015-02-06 DIAGNOSIS — M6281 Muscle weakness (generalized): Secondary | ICD-10-CM | POA: Diagnosis not present

## 2015-02-06 DIAGNOSIS — R278 Other lack of coordination: Secondary | ICD-10-CM | POA: Diagnosis not present

## 2015-02-06 DIAGNOSIS — R4181 Age-related cognitive decline: Secondary | ICD-10-CM | POA: Diagnosis not present

## 2015-02-07 DIAGNOSIS — M6281 Muscle weakness (generalized): Secondary | ICD-10-CM | POA: Diagnosis not present

## 2015-02-07 DIAGNOSIS — R4181 Age-related cognitive decline: Secondary | ICD-10-CM | POA: Diagnosis not present

## 2015-02-07 DIAGNOSIS — R278 Other lack of coordination: Secondary | ICD-10-CM | POA: Diagnosis not present

## 2015-02-08 DIAGNOSIS — R4181 Age-related cognitive decline: Secondary | ICD-10-CM | POA: Diagnosis not present

## 2015-02-08 DIAGNOSIS — R278 Other lack of coordination: Secondary | ICD-10-CM | POA: Diagnosis not present

## 2015-02-08 DIAGNOSIS — M6281 Muscle weakness (generalized): Secondary | ICD-10-CM | POA: Diagnosis not present

## 2015-02-09 DIAGNOSIS — M6281 Muscle weakness (generalized): Secondary | ICD-10-CM | POA: Diagnosis not present

## 2015-02-09 DIAGNOSIS — R4181 Age-related cognitive decline: Secondary | ICD-10-CM | POA: Diagnosis not present

## 2015-02-09 DIAGNOSIS — R278 Other lack of coordination: Secondary | ICD-10-CM | POA: Diagnosis not present

## 2015-02-12 DIAGNOSIS — M6281 Muscle weakness (generalized): Secondary | ICD-10-CM | POA: Diagnosis not present

## 2015-02-12 DIAGNOSIS — R278 Other lack of coordination: Secondary | ICD-10-CM | POA: Diagnosis not present

## 2015-02-12 DIAGNOSIS — R4181 Age-related cognitive decline: Secondary | ICD-10-CM | POA: Diagnosis not present

## 2015-02-13 DIAGNOSIS — E119 Type 2 diabetes mellitus without complications: Secondary | ICD-10-CM | POA: Diagnosis not present

## 2015-02-13 DIAGNOSIS — M6281 Muscle weakness (generalized): Secondary | ICD-10-CM | POA: Diagnosis not present

## 2015-02-13 DIAGNOSIS — G301 Alzheimer's disease with late onset: Secondary | ICD-10-CM | POA: Diagnosis not present

## 2015-02-13 DIAGNOSIS — R4181 Age-related cognitive decline: Secondary | ICD-10-CM | POA: Diagnosis not present

## 2015-02-13 DIAGNOSIS — R278 Other lack of coordination: Secondary | ICD-10-CM | POA: Diagnosis not present

## 2015-02-14 DIAGNOSIS — R4181 Age-related cognitive decline: Secondary | ICD-10-CM | POA: Diagnosis not present

## 2015-02-14 DIAGNOSIS — M6281 Muscle weakness (generalized): Secondary | ICD-10-CM | POA: Diagnosis not present

## 2015-02-14 DIAGNOSIS — R278 Other lack of coordination: Secondary | ICD-10-CM | POA: Diagnosis not present

## 2015-03-06 DIAGNOSIS — L814 Other melanin hyperpigmentation: Secondary | ICD-10-CM | POA: Diagnosis not present

## 2015-03-06 DIAGNOSIS — L570 Actinic keratosis: Secondary | ICD-10-CM | POA: Diagnosis not present

## 2015-03-06 DIAGNOSIS — D485 Neoplasm of uncertain behavior of skin: Secondary | ICD-10-CM | POA: Diagnosis not present

## 2015-03-06 DIAGNOSIS — L245 Irritant contact dermatitis due to other chemical products: Secondary | ICD-10-CM | POA: Diagnosis not present

## 2015-03-06 DIAGNOSIS — L821 Other seborrheic keratosis: Secondary | ICD-10-CM | POA: Diagnosis not present

## 2015-03-19 DIAGNOSIS — B9789 Other viral agents as the cause of diseases classified elsewhere: Secondary | ICD-10-CM | POA: Diagnosis not present

## 2015-03-19 DIAGNOSIS — J069 Acute upper respiratory infection, unspecified: Secondary | ICD-10-CM | POA: Diagnosis not present

## 2015-04-19 DIAGNOSIS — H04123 Dry eye syndrome of bilateral lacrimal glands: Secondary | ICD-10-CM | POA: Diagnosis not present

## 2015-04-19 DIAGNOSIS — E1121 Type 2 diabetes mellitus with diabetic nephropathy: Secondary | ICD-10-CM | POA: Diagnosis not present

## 2015-04-19 DIAGNOSIS — E1140 Type 2 diabetes mellitus with diabetic neuropathy, unspecified: Secondary | ICD-10-CM | POA: Diagnosis not present

## 2015-04-19 DIAGNOSIS — Z7984 Long term (current) use of oral hypoglycemic drugs: Secondary | ICD-10-CM | POA: Diagnosis not present

## 2015-04-19 DIAGNOSIS — H5213 Myopia, bilateral: Secondary | ICD-10-CM | POA: Diagnosis not present

## 2015-04-19 DIAGNOSIS — I129 Hypertensive chronic kidney disease with stage 1 through stage 4 chronic kidney disease, or unspecified chronic kidney disease: Secondary | ICD-10-CM | POA: Diagnosis not present

## 2015-04-19 DIAGNOSIS — R413 Other amnesia: Secondary | ICD-10-CM | POA: Diagnosis not present

## 2015-04-19 DIAGNOSIS — E7800 Pure hypercholesterolemia, unspecified: Secondary | ICD-10-CM | POA: Diagnosis not present

## 2015-04-19 DIAGNOSIS — H524 Presbyopia: Secondary | ICD-10-CM | POA: Diagnosis not present

## 2015-04-19 DIAGNOSIS — Z79899 Other long term (current) drug therapy: Secondary | ICD-10-CM | POA: Diagnosis not present

## 2015-04-19 DIAGNOSIS — H35033 Hypertensive retinopathy, bilateral: Secondary | ICD-10-CM | POA: Diagnosis not present

## 2015-04-19 DIAGNOSIS — N183 Chronic kidney disease, stage 3 (moderate): Secondary | ICD-10-CM | POA: Diagnosis not present

## 2015-10-25 DIAGNOSIS — E1121 Type 2 diabetes mellitus with diabetic nephropathy: Secondary | ICD-10-CM | POA: Diagnosis not present

## 2015-10-25 DIAGNOSIS — Z0000 Encounter for general adult medical examination without abnormal findings: Secondary | ICD-10-CM | POA: Diagnosis not present

## 2015-10-25 DIAGNOSIS — N183 Chronic kidney disease, stage 3 (moderate): Secondary | ICD-10-CM | POA: Diagnosis not present

## 2015-10-25 DIAGNOSIS — E669 Obesity, unspecified: Secondary | ICD-10-CM | POA: Diagnosis not present

## 2015-10-25 DIAGNOSIS — F325 Major depressive disorder, single episode, in full remission: Secondary | ICD-10-CM | POA: Diagnosis not present

## 2015-10-25 DIAGNOSIS — I129 Hypertensive chronic kidney disease with stage 1 through stage 4 chronic kidney disease, or unspecified chronic kidney disease: Secondary | ICD-10-CM | POA: Diagnosis not present

## 2015-10-25 DIAGNOSIS — Z79899 Other long term (current) drug therapy: Secondary | ICD-10-CM | POA: Diagnosis not present

## 2015-10-25 DIAGNOSIS — E1140 Type 2 diabetes mellitus with diabetic neuropathy, unspecified: Secondary | ICD-10-CM | POA: Diagnosis not present

## 2015-10-25 DIAGNOSIS — E236 Other disorders of pituitary gland: Secondary | ICD-10-CM | POA: Diagnosis not present

## 2016-01-18 DIAGNOSIS — J069 Acute upper respiratory infection, unspecified: Secondary | ICD-10-CM | POA: Diagnosis not present

## 2016-01-31 DIAGNOSIS — H04123 Dry eye syndrome of bilateral lacrimal glands: Secondary | ICD-10-CM | POA: Diagnosis not present

## 2016-02-28 DIAGNOSIS — Z6830 Body mass index (BMI) 30.0-30.9, adult: Secondary | ICD-10-CM | POA: Diagnosis not present

## 2016-02-28 DIAGNOSIS — I129 Hypertensive chronic kidney disease with stage 1 through stage 4 chronic kidney disease, or unspecified chronic kidney disease: Secondary | ICD-10-CM | POA: Diagnosis not present

## 2016-02-28 DIAGNOSIS — Z7984 Long term (current) use of oral hypoglycemic drugs: Secondary | ICD-10-CM | POA: Diagnosis not present

## 2016-02-28 DIAGNOSIS — E1121 Type 2 diabetes mellitus with diabetic nephropathy: Secondary | ICD-10-CM | POA: Diagnosis not present

## 2016-02-28 DIAGNOSIS — Z79899 Other long term (current) drug therapy: Secondary | ICD-10-CM | POA: Diagnosis not present

## 2016-02-28 DIAGNOSIS — E669 Obesity, unspecified: Secondary | ICD-10-CM | POA: Diagnosis not present

## 2016-02-28 DIAGNOSIS — N183 Chronic kidney disease, stage 3 (moderate): Secondary | ICD-10-CM | POA: Diagnosis not present

## 2016-02-28 DIAGNOSIS — R112 Nausea with vomiting, unspecified: Secondary | ICD-10-CM | POA: Diagnosis not present

## 2016-02-28 DIAGNOSIS — E1140 Type 2 diabetes mellitus with diabetic neuropathy, unspecified: Secondary | ICD-10-CM | POA: Diagnosis not present

## 2016-06-25 DIAGNOSIS — I129 Hypertensive chronic kidney disease with stage 1 through stage 4 chronic kidney disease, or unspecified chronic kidney disease: Secondary | ICD-10-CM | POA: Diagnosis not present

## 2016-06-25 DIAGNOSIS — Z7984 Long term (current) use of oral hypoglycemic drugs: Secondary | ICD-10-CM | POA: Diagnosis not present

## 2016-06-25 DIAGNOSIS — E669 Obesity, unspecified: Secondary | ICD-10-CM | POA: Diagnosis not present

## 2016-06-25 DIAGNOSIS — E1121 Type 2 diabetes mellitus with diabetic nephropathy: Secondary | ICD-10-CM | POA: Diagnosis not present

## 2016-06-25 DIAGNOSIS — E1140 Type 2 diabetes mellitus with diabetic neuropathy, unspecified: Secondary | ICD-10-CM | POA: Diagnosis not present

## 2016-06-25 DIAGNOSIS — N183 Chronic kidney disease, stage 3 (moderate): Secondary | ICD-10-CM | POA: Diagnosis not present

## 2016-06-25 DIAGNOSIS — Z6830 Body mass index (BMI) 30.0-30.9, adult: Secondary | ICD-10-CM | POA: Diagnosis not present

## 2016-10-14 DIAGNOSIS — Z23 Encounter for immunization: Secondary | ICD-10-CM | POA: Diagnosis not present

## 2016-11-17 DIAGNOSIS — Z79899 Other long term (current) drug therapy: Secondary | ICD-10-CM | POA: Diagnosis not present

## 2016-11-17 DIAGNOSIS — Z0000 Encounter for general adult medical examination without abnormal findings: Secondary | ICD-10-CM | POA: Diagnosis not present

## 2016-11-17 DIAGNOSIS — N183 Chronic kidney disease, stage 3 (moderate): Secondary | ICD-10-CM | POA: Diagnosis not present

## 2016-11-17 DIAGNOSIS — R413 Other amnesia: Secondary | ICD-10-CM | POA: Diagnosis not present

## 2016-11-17 DIAGNOSIS — E1121 Type 2 diabetes mellitus with diabetic nephropathy: Secondary | ICD-10-CM | POA: Diagnosis not present

## 2016-11-17 DIAGNOSIS — F5101 Primary insomnia: Secondary | ICD-10-CM | POA: Diagnosis not present

## 2016-11-17 DIAGNOSIS — E7800 Pure hypercholesterolemia, unspecified: Secondary | ICD-10-CM | POA: Diagnosis not present

## 2016-11-17 DIAGNOSIS — I129 Hypertensive chronic kidney disease with stage 1 through stage 4 chronic kidney disease, or unspecified chronic kidney disease: Secondary | ICD-10-CM | POA: Diagnosis not present

## 2016-11-17 DIAGNOSIS — E1140 Type 2 diabetes mellitus with diabetic neuropathy, unspecified: Secondary | ICD-10-CM | POA: Diagnosis not present

## 2016-11-17 DIAGNOSIS — F325 Major depressive disorder, single episode, in full remission: Secondary | ICD-10-CM | POA: Diagnosis not present

## 2016-11-17 DIAGNOSIS — Z23 Encounter for immunization: Secondary | ICD-10-CM | POA: Diagnosis not present

## 2017-01-12 DIAGNOSIS — R410 Disorientation, unspecified: Secondary | ICD-10-CM | POA: Diagnosis not present

## 2017-01-12 DIAGNOSIS — E119 Type 2 diabetes mellitus without complications: Secondary | ICD-10-CM | POA: Diagnosis not present

## 2017-03-27 DIAGNOSIS — E119 Type 2 diabetes mellitus without complications: Secondary | ICD-10-CM | POA: Diagnosis not present

## 2017-03-27 DIAGNOSIS — R319 Hematuria, unspecified: Secondary | ICD-10-CM | POA: Diagnosis not present

## 2017-05-18 DIAGNOSIS — I129 Hypertensive chronic kidney disease with stage 1 through stage 4 chronic kidney disease, or unspecified chronic kidney disease: Secondary | ICD-10-CM | POA: Diagnosis not present

## 2017-05-18 DIAGNOSIS — E1121 Type 2 diabetes mellitus with diabetic nephropathy: Secondary | ICD-10-CM | POA: Diagnosis not present

## 2017-05-18 DIAGNOSIS — N183 Chronic kidney disease, stage 3 (moderate): Secondary | ICD-10-CM | POA: Diagnosis not present

## 2017-05-18 DIAGNOSIS — E1140 Type 2 diabetes mellitus with diabetic neuropathy, unspecified: Secondary | ICD-10-CM | POA: Diagnosis not present

## 2017-05-18 DIAGNOSIS — R413 Other amnesia: Secondary | ICD-10-CM | POA: Diagnosis not present

## 2017-06-16 DIAGNOSIS — R319 Hematuria, unspecified: Secondary | ICD-10-CM | POA: Diagnosis not present

## 2017-06-16 DIAGNOSIS — N390 Urinary tract infection, site not specified: Secondary | ICD-10-CM | POA: Diagnosis not present

## 2017-06-23 DIAGNOSIS — E7800 Pure hypercholesterolemia, unspecified: Secondary | ICD-10-CM | POA: Diagnosis not present

## 2017-06-23 DIAGNOSIS — E1140 Type 2 diabetes mellitus with diabetic neuropathy, unspecified: Secondary | ICD-10-CM | POA: Diagnosis not present

## 2017-06-23 DIAGNOSIS — I129 Hypertensive chronic kidney disease with stage 1 through stage 4 chronic kidney disease, or unspecified chronic kidney disease: Secondary | ICD-10-CM | POA: Diagnosis not present

## 2017-06-23 DIAGNOSIS — N183 Chronic kidney disease, stage 3 (moderate): Secondary | ICD-10-CM | POA: Diagnosis not present

## 2017-06-23 DIAGNOSIS — Z79899 Other long term (current) drug therapy: Secondary | ICD-10-CM | POA: Diagnosis not present

## 2017-06-23 DIAGNOSIS — E1121 Type 2 diabetes mellitus with diabetic nephropathy: Secondary | ICD-10-CM | POA: Diagnosis not present

## 2017-06-26 ENCOUNTER — Emergency Department (HOSPITAL_COMMUNITY)
Admission: EM | Admit: 2017-06-26 | Discharge: 2017-06-26 | Disposition: A | Discharge status: Home / Self Care | Payer: Medicare Other | Attending: Emergency Medicine | Admitting: Emergency Medicine

## 2017-06-26 ENCOUNTER — Emergency Department (HOSPITAL_COMMUNITY): Payer: Medicare Other

## 2017-06-26 ENCOUNTER — Encounter (HOSPITAL_COMMUNITY): Payer: Self-pay | Admitting: Emergency Medicine

## 2017-06-26 ENCOUNTER — Other Ambulatory Visit: Payer: Self-pay

## 2017-06-26 DIAGNOSIS — R55 Syncope and collapse: Secondary | ICD-10-CM | POA: Diagnosis not present

## 2017-06-26 DIAGNOSIS — Y92128 Other place in nursing home as the place of occurrence of the external cause: Secondary | ICD-10-CM | POA: Insufficient documentation

## 2017-06-26 DIAGNOSIS — I10 Essential (primary) hypertension: Secondary | ICD-10-CM | POA: Diagnosis not present

## 2017-06-26 DIAGNOSIS — Y9301 Activity, walking, marching and hiking: Secondary | ICD-10-CM | POA: Insufficient documentation

## 2017-06-26 DIAGNOSIS — E1140 Type 2 diabetes mellitus with diabetic neuropathy, unspecified: Secondary | ICD-10-CM | POA: Insufficient documentation

## 2017-06-26 DIAGNOSIS — Z79899 Other long term (current) drug therapy: Secondary | ICD-10-CM | POA: Diagnosis not present

## 2017-06-26 DIAGNOSIS — R4020 Unspecified coma: Secondary | ICD-10-CM | POA: Diagnosis not present

## 2017-06-26 DIAGNOSIS — S0990XA Unspecified injury of head, initial encounter: Secondary | ICD-10-CM | POA: Insufficient documentation

## 2017-06-26 DIAGNOSIS — R410 Disorientation, unspecified: Secondary | ICD-10-CM | POA: Diagnosis not present

## 2017-06-26 DIAGNOSIS — F0390 Unspecified dementia without behavioral disturbance: Secondary | ICD-10-CM | POA: Diagnosis not present

## 2017-06-26 DIAGNOSIS — Z9114 Patient's other noncompliance with medication regimen: Secondary | ICD-10-CM | POA: Diagnosis not present

## 2017-06-26 DIAGNOSIS — Z7984 Long term (current) use of oral hypoglycemic drugs: Secondary | ICD-10-CM | POA: Insufficient documentation

## 2017-06-26 DIAGNOSIS — Z91148 Patient's other noncompliance with medication regimen for other reason: Secondary | ICD-10-CM

## 2017-06-26 DIAGNOSIS — W19XXXA Unspecified fall, initial encounter: Secondary | ICD-10-CM

## 2017-06-26 DIAGNOSIS — Y999 Unspecified external cause status: Secondary | ICD-10-CM | POA: Diagnosis not present

## 2017-06-26 DIAGNOSIS — R0902 Hypoxemia: Secondary | ICD-10-CM | POA: Diagnosis not present

## 2017-06-26 LAB — CBC WITH DIFFERENTIAL/PLATELET
Abs Immature Granulocytes: 0.1 10*3/uL (ref 0.0–0.1)
Basophils Absolute: 0.1 10*3/uL (ref 0.0–0.1)
Basophils Relative: 0 %
EOS ABS: 0.1 10*3/uL (ref 0.0–0.7)
Eosinophils Relative: 1 %
HEMATOCRIT: 44.9 % (ref 36.0–46.0)
Hemoglobin: 14.4 g/dL (ref 12.0–15.0)
IMMATURE GRANULOCYTES: 0 %
LYMPHS ABS: 2 10*3/uL (ref 0.7–4.0)
Lymphocytes Relative: 15 %
MCH: 28.9 pg (ref 26.0–34.0)
MCHC: 32.1 g/dL (ref 30.0–36.0)
MCV: 90 fL (ref 78.0–100.0)
MONOS PCT: 7 %
Monocytes Absolute: 0.9 10*3/uL (ref 0.1–1.0)
Neutro Abs: 10.3 10*3/uL — ABNORMAL HIGH (ref 1.7–7.7)
Neutrophils Relative %: 77 %
PLATELETS: 280 10*3/uL (ref 150–400)
RBC: 4.99 MIL/uL (ref 3.87–5.11)
RDW: 13.1 % (ref 11.5–15.5)
WBC: 13.5 10*3/uL — ABNORMAL HIGH (ref 4.0–10.5)

## 2017-06-26 LAB — COMPREHENSIVE METABOLIC PANEL
ALT: 23 U/L (ref 14–54)
AST: 29 U/L (ref 15–41)
Albumin: 3.8 g/dL (ref 3.5–5.0)
Alkaline Phosphatase: 74 U/L (ref 38–126)
Anion gap: 10 (ref 5–15)
BILIRUBIN TOTAL: 1 mg/dL (ref 0.3–1.2)
BUN: 13 mg/dL (ref 6–20)
CHLORIDE: 106 mmol/L (ref 101–111)
CO2: 25 mmol/L (ref 22–32)
Calcium: 9.4 mg/dL (ref 8.9–10.3)
Creatinine, Ser: 1.09 mg/dL — ABNORMAL HIGH (ref 0.44–1.00)
GFR calc Af Amer: 50 mL/min — ABNORMAL LOW (ref >60)
GFR calc non Af Amer: 43 mL/min — ABNORMAL LOW (ref >60)
Glucose, Bld: 132 mg/dL — ABNORMAL HIGH (ref 65–99)
POTASSIUM: 4.6 mmol/L (ref 3.5–5.1)
Sodium: 141 mmol/L (ref 135–145)
Total Protein: 6.5 g/dL (ref 6.5–8.1)

## 2017-06-26 LAB — URINALYSIS, ROUTINE W REFLEX MICROSCOPIC
Bilirubin Urine: NEGATIVE
Glucose, UA: NEGATIVE mg/dL
Hgb urine dipstick: NEGATIVE
Ketones, ur: NEGATIVE mg/dL
LEUKOCYTES UA: NEGATIVE
Nitrite: NEGATIVE
PH: 6 (ref 5.0–8.0)
Protein, ur: NEGATIVE mg/dL
SPECIFIC GRAVITY, URINE: 1.013 (ref 1.005–1.030)

## 2017-06-26 LAB — I-STAT TROPONIN, ED: Troponin i, poc: 0.01 ng/mL (ref 0.00–0.08)

## 2017-06-26 MED ORDER — CLONIDINE HCL 0.2 MG PO TABS
0.3000 mg | ORAL_TABLET | Freq: Once | ORAL | Status: AC
  Administered 2017-06-26: 0.3 mg via ORAL
  Filled 2017-06-26: qty 1

## 2017-06-26 NOTE — ED Triage Notes (Signed)
Per [facility] pt coming from [facility] after having a fall and hitting head causing LOC. Pt is not on blood thinners. Pt had fall last week and was evaluated by EMS however refused transport. Staff reports mild dementia as baseline. Denies any pain.

## 2017-06-26 NOTE — Discharge Instructions (Signed)
Evaluation in emergency department is normal. Patient reports fall. Please return to ED if worse at any time. Recheck with her physician asap.

## 2017-06-26 NOTE — ED Notes (Signed)
Patient transported to ct

## 2017-06-26 NOTE — ED Provider Notes (Signed)
[facility] EMERGENCY DEPARTMENT Provider Note   CSN: [id number] Arrival date & time: 06/26/17  7412     History   Chief Complaint Chief Complaint  Patient presents with  . Loss of Consciousness  . Fall    HPI Ariel Rogers is a 84 y.o. female.  HPI  Level 5 caveat secondary to not clearly remembering the event 84 year old female from assisted living care facility with reports she has syncopal episode today.  Patient stated to nurse that she was unclear whether she passed out or fell.  On my history, she states that she was walking to the bathroom and lost her balance.  She denies any other complaints.  RN reports that she hit her head and had a loss of consciousness.  They also report mild dementia at baseline.  Patient denies any other injuries.  I do not see in the reports that there were any other concern for injury.  Past Medical History:  Diagnosis Date  . Diabetes ([place])   . Diverticulosis   . Fibromyalgia   . High cholesterol   . Hyperhidrosis   . Hypertension   . Neuropathy    in back and legs  . Renal disease     There are no active problems to display for this patient.   Past Surgical History:  Procedure Laterality Date  . APPENDECTOMY  1994  . CATARACT EXTRACTION    . CHOLECYSTECTOMY  1994  . OTHER SURGICAL HISTORY     hyperhidrosis  . SHOULDER SURGERY  2004  . TONSILLECTOMY  2010  . VAGINAL HYSTERECTOMY  age 12     OB History   None      Home Medications    Prior to Admission medications   Medication Sig Start Date End Date Taking? Authorizing Provider  amitriptyline (ELAVIL) 50 MG tablet Take 25 mg by mouth at bedtime as needed for sleep. 1/2 tab once daily   Yes [provider]  acetaminophen (TYLENOL) 500 MG tablet Take 500 mg by mouth at bedtime as needed (sleep, takes along with the sleep aid).    [provider]  ciprofloxacin (CIPRO) 500 MG tablet Take 1 tablet (500 mg total) by mouth 2 (two) times  daily. 09/02/14   [name], MD  cloNIDine (CATAPRES) 0.2 MG tablet Take 0.2 mg by mouth 2 (two) times daily.    [provider]  doxylamine, Sleep, (SLEEP AID) 25 MG tablet Take 25 mg by mouth at bedtime as needed.    [provider]  GABAPENTIN PO Take by mouth.    [provider]  hydrochlorothiazide (HYDRODIURIL) 25 MG tablet Take 25 mg by mouth daily.    [provider]  lidocaine (LIDODERM) 5 % Place 1 patch onto the skin daily. Remove & Discard patch within 12 hours or as directed by MD 11/09/12   Penumalli, Earlean Polka, MD  lovastatin (ALTOPREV) 40 MG 24 hr tablet Take 40 mg by mouth at bedtime.    [provider]  Multiple Vitamins-Minerals (MULTIVITAMIN WITH MINERALS) tablet Take 1 tablet by mouth daily.    [provider]  multivitamin (METANX) 3-35-2 MG TABS tablet Take 1 tablet by mouth 2 (two) times daily.    [provider]  Omega-3 Fatty Acids (FISH OIL) 1000 MG CAPS Take 1 capsule by mouth 2 (two) times daily.    [provider]  pantoprazole (PROTONIX) 40 MG tablet Take 40 mg by mouth daily.    [provider]  pioglitazone (ACTOS) 30 MG tablet Take 30 mg by mouth daily.    [provider]  vitamin D, CHOLECALCIFEROL, 400 UNITS tablet Take 400 Units by mouth daily.    [provider]    Family History Family History  Problem Relation Age of Onset  . Heart Problems Son   . Lung cancer Son   . Pneumonia Mother   . Lung cancer Father   . Lung cancer Sister   . Lung cancer Sister   . Heart disease Sister     Social History Social History   Tobacco Use  . Smoking status: Never Smoker  . Smokeless tobacco: Never Used  Substance Use Topics  . Alcohol use: No  . Drug use: No     Allergies   Caffeine and Codeine   Review of Systems Review of Systems  Unable to perform ROS: Other (Patient states that she does not remember when asked about most of these questions)    All other systems reviewed and are negative.    Physical Exam Updated Vital Signs BP (!) 205/65   Pulse 67   Temp 98.5 F (36.9 C) (Oral)   Resp (!) 23   SpO2 98%   Physical Exam  Constitutional: She is oriented to person, place, and time. She appears well-developed and well-nourished. No distress.  HENT:  Head: Normocephalic and atraumatic.  Right Ear: External ear normal.  Left Ear: External ear normal.  Eyes: Pupils are equal, round, and reactive to light. EOM are normal.  Neck: Normal range of motion. Neck supple.  Cardiovascular: Normal rate.  Pulmonary/Chest: Effort normal and breath sounds normal.  Abdominal: Soft. Bowel sounds are normal.  Musculoskeletal: Normal range of motion. She exhibits no edema, tenderness or deformity.  Neurological: She is alert and oriented to person, place, and time. She displays normal reflexes. No cranial nerve deficit or sensory deficit. She exhibits normal muscle tone. Coordination normal.  Skin: Skin is warm and dry. Capillary refill takes less than 2 seconds.  Psychiatric: She has a normal mood and affect.  Nursing note and vitals reviewed.    ED Treatments / Results  Labs (all labs ordered are listed, but only abnormal results are displayed) Labs Reviewed - No data to display  EKG EKG Interpretation  Date/Time:  Friday June 26 2017 09:43:22 EDT Ventricular Rate:  66 PR Interval:    QRS Duration: 88 QT Interval:  445 QTC Calculation: 467 R Axis:   -39 Text Interpretation:  Sinus rhythm Left axis deviation Anterior infarct, old No significant change since last tracing 26 January 2013 Confirmed by Pattricia Boss [phone]) on 06/26/2017 10:33:45 AM   Radiology No results found.  Procedures Procedures (including critical care time)  Medications Ordered in ED Medications  cloNIDine (CATAPRES) tablet 0.3 mg (has no administration in time range)     Initial Impression / Assessment and Plan / ED Course  I have reviewed the  triage vital signs and the nursing notes.  Pertinent labs & imaging results that were available during my care of the patient were reviewed by me and considered in my medical decision making (see chart for details).    84 year old female with fall today, unclear whether syncope or mechanical fall.  Evaluation here reveals no evidence of injury.  She was very hypertensive but had not taken her a.m. clonidine.  She is dosed here with clonidine and systolic blood pressure is now 150.  EKG unchanged from prior with normal troponin.  [place] syncope rule  low risk. Plan discharge back to facility with strict return precautions.  Final Clinical Impressions(s) / ED Diagnoses   Final diagnoses:  Fall, initial encounter  Hypertension, unspecified type  Noncompliance with medication regimen    ED Discharge Orders    None       Pattricia Boss, MD 06/26/17 1253

## 2017-06-30 DIAGNOSIS — R109 Unspecified abdominal pain: Secondary | ICD-10-CM | POA: Diagnosis not present

## 2017-06-30 DIAGNOSIS — I10 Essential (primary) hypertension: Secondary | ICD-10-CM | POA: Diagnosis not present

## 2017-06-30 DIAGNOSIS — I499 Cardiac arrhythmia, unspecified: Secondary | ICD-10-CM | POA: Diagnosis not present

## 2017-06-30 DIAGNOSIS — I2510 Atherosclerotic heart disease of native coronary artery without angina pectoris: Secondary | ICD-10-CM | POA: Diagnosis not present

## 2017-06-30 DIAGNOSIS — R110 Nausea: Secondary | ICD-10-CM | POA: Diagnosis not present

## 2017-06-30 DIAGNOSIS — E119 Type 2 diabetes mellitus without complications: Secondary | ICD-10-CM | POA: Diagnosis not present

## 2017-06-30 DIAGNOSIS — F0390 Unspecified dementia without behavioral disturbance: Secondary | ICD-10-CM | POA: Diagnosis not present

## 2017-06-30 DIAGNOSIS — G8929 Other chronic pain: Secondary | ICD-10-CM | POA: Diagnosis not present

## 2017-06-30 DIAGNOSIS — K219 Gastro-esophageal reflux disease without esophagitis: Secondary | ICD-10-CM | POA: Diagnosis not present

## 2017-06-30 DIAGNOSIS — R197 Diarrhea, unspecified: Secondary | ICD-10-CM | POA: Diagnosis not present

## 2017-06-30 DIAGNOSIS — R509 Fever, unspecified: Secondary | ICD-10-CM | POA: Diagnosis not present

## 2017-06-30 DIAGNOSIS — M6281 Muscle weakness (generalized): Secondary | ICD-10-CM | POA: Diagnosis not present

## 2017-06-30 DIAGNOSIS — R1111 Vomiting without nausea: Secondary | ICD-10-CM | POA: Diagnosis not present

## 2017-06-30 DIAGNOSIS — E785 Hyperlipidemia, unspecified: Secondary | ICD-10-CM | POA: Diagnosis not present

## 2017-06-30 DIAGNOSIS — N183 Chronic kidney disease, stage 3 (moderate): Secondary | ICD-10-CM | POA: Diagnosis not present

## 2017-07-06 DIAGNOSIS — 419620001 Death: Secondary | SNOMED CT | POA: Diagnosis not present

## 2017-07-06 DEATH — deceased
